# Patient Record
Sex: Female | Born: 1955
Health system: Southern US, Community
[De-identification: ages and names within clinical notes are randomized; demographics above are authoritative.]

## PROBLEM LIST (undated history)

## (undated) DIAGNOSIS — M1990 Unspecified osteoarthritis, unspecified site: Secondary | ICD-10-CM

## (undated) DIAGNOSIS — I10 Essential (primary) hypertension: Secondary | ICD-10-CM

## (undated) HISTORY — PX: CHOLECYSTECTOMY: SHX55

## (undated) HISTORY — PX: TUBAL LIGATION: SHX77

## (undated) HISTORY — PX: FOOT OSTEOTOMY: SHX957

## (undated) HISTORY — PX: TONSILLECTOMY: SUR1361

---

## 1998-07-17 ENCOUNTER — Ambulatory Visit (HOSPITAL_COMMUNITY): Admission: RE | Admit: 1998-07-17 | Discharge: 1998-07-17 | Payer: Self-pay | Admitting: Obstetrics and Gynecology

## 1998-08-30 ENCOUNTER — Inpatient Hospital Stay (HOSPITAL_COMMUNITY): Admission: AD | Admit: 1998-08-30 | Discharge: 1998-08-30 | Payer: Self-pay | Admitting: Obstetrics and Gynecology

## 1998-09-23 ENCOUNTER — Inpatient Hospital Stay (HOSPITAL_COMMUNITY): Admission: AD | Admit: 1998-09-23 | Discharge: 1998-09-26 | Payer: Self-pay | Admitting: Obstetrics and Gynecology

## 1998-11-05 ENCOUNTER — Other Ambulatory Visit: Admission: RE | Admit: 1998-11-05 | Discharge: 1998-11-05 | Payer: Self-pay | Admitting: Obstetrics and Gynecology

## 1999-11-12 ENCOUNTER — Other Ambulatory Visit: Admission: RE | Admit: 1999-11-12 | Discharge: 1999-11-12 | Payer: Self-pay | Admitting: Obstetrics and Gynecology

## 2001-02-11 ENCOUNTER — Other Ambulatory Visit: Admission: RE | Admit: 2001-02-11 | Discharge: 2001-02-11 | Payer: Self-pay | Admitting: Obstetrics and Gynecology

## 2002-04-19 ENCOUNTER — Other Ambulatory Visit: Admission: RE | Admit: 2002-04-19 | Discharge: 2002-04-19 | Payer: Self-pay | Admitting: Obstetrics and Gynecology

## 2002-06-26 ENCOUNTER — Encounter: Admission: RE | Admit: 2002-06-26 | Discharge: 2002-07-26 | Payer: Self-pay | Admitting: Orthopedic Surgery

## 2003-05-16 ENCOUNTER — Other Ambulatory Visit: Admission: RE | Admit: 2003-05-16 | Discharge: 2003-05-16 | Payer: Self-pay | Admitting: Obstetrics and Gynecology

## 2004-06-05 ENCOUNTER — Other Ambulatory Visit: Admission: RE | Admit: 2004-06-05 | Discharge: 2004-06-05 | Payer: Self-pay | Admitting: Obstetrics and Gynecology

## 2005-07-03 ENCOUNTER — Other Ambulatory Visit: Admission: RE | Admit: 2005-07-03 | Discharge: 2005-07-03 | Payer: Self-pay | Admitting: Obstetrics and Gynecology

## 2005-08-17 HISTORY — PX: ABDOMINAL HYSTERECTOMY: SHX81

## 2005-12-21 ENCOUNTER — Ambulatory Visit (HOSPITAL_COMMUNITY): Admission: RE | Admit: 2005-12-21 | Discharge: 2005-12-21 | Payer: Self-pay | Admitting: Obstetrics and Gynecology

## 2006-01-15 ENCOUNTER — Encounter: Admission: RE | Admit: 2006-01-15 | Discharge: 2006-01-15 | Payer: Self-pay | Admitting: Gastroenterology

## 2006-01-18 ENCOUNTER — Ambulatory Visit (HOSPITAL_COMMUNITY): Admission: RE | Admit: 2006-01-18 | Discharge: 2006-01-18 | Payer: Self-pay | Admitting: Gastroenterology

## 2006-01-18 ENCOUNTER — Encounter (INDEPENDENT_AMBULATORY_CARE_PROVIDER_SITE_OTHER): Payer: Self-pay | Admitting: Specialist

## 2006-04-26 ENCOUNTER — Encounter (INDEPENDENT_AMBULATORY_CARE_PROVIDER_SITE_OTHER): Payer: Self-pay | Admitting: *Deleted

## 2006-04-27 ENCOUNTER — Inpatient Hospital Stay (HOSPITAL_COMMUNITY): Admission: RE | Admit: 2006-04-27 | Discharge: 2006-04-28 | Payer: Self-pay | Admitting: Obstetrics and Gynecology

## 2006-08-17 HISTORY — PX: JOINT REPLACEMENT: SHX530

## 2007-07-06 ENCOUNTER — Inpatient Hospital Stay (HOSPITAL_COMMUNITY): Admission: RE | Admit: 2007-07-06 | Discharge: 2007-07-11 | Payer: Self-pay | Admitting: Orthopedic Surgery

## 2009-11-02 ENCOUNTER — Ambulatory Visit (HOSPITAL_COMMUNITY): Admission: RE | Admit: 2009-11-02 | Discharge: 2009-11-02 | Payer: Self-pay | Admitting: Orthopedic Surgery

## 2010-09-07 ENCOUNTER — Encounter: Payer: Self-pay | Admitting: Podiatry

## 2010-11-09 LAB — URINALYSIS, ROUTINE W REFLEX MICROSCOPIC
Glucose, UA: NEGATIVE mg/dL
Hgb urine dipstick: NEGATIVE
Ketones, ur: NEGATIVE mg/dL
Specific Gravity, Urine: 1.017 (ref 1.005–1.030)
Urobilinogen, UA: 0.2 mg/dL (ref 0.0–1.0)
pH: 6.5 (ref 5.0–8.0)

## 2010-11-09 LAB — COMPREHENSIVE METABOLIC PANEL
Calcium: 9.7 mg/dL (ref 8.4–10.5)
Chloride: 102 mEq/L (ref 96–112)
Creatinine, Ser: 0.81 mg/dL (ref 0.4–1.2)
GFR calc non Af Amer: >60 mL/min (ref >60)
Glucose, Bld: 97 mg/dL (ref 70–99)
Potassium: 4.5 mEq/L (ref 3.5–5.1)

## 2010-11-09 LAB — PROTIME-INR
INR: 1.04 (ref 0.00–1.49)
Prothrombin Time: 13.5 seconds (ref 11.6–15.2)

## 2010-11-09 LAB — CBC: MCHC: 33.2 g/dL (ref 30.0–36.0)

## 2010-12-30 NOTE — Discharge Summary (Signed)
[name], [name]        ACCOUNT NO.:  [id number]   MEDICAL RECORD NO.:  [id number]          PATIENT TYPE:  INP   LOCATION:  1608                         FACILITY:  [facility]   PHYSICIAN:  Ollen Gross, M.D.    DATE OF BIRTH:  [birth year]   DATE OF ADMISSION:  07/06/2007  DATE OF DISCHARGE:  07/11/2007                               DISCHARGE SUMMARY   ADMITTING DIAGNOSES:  1. Osteoarthritis, bilateral knees.  2. Borderline hypertension.   DISCHARGE DIAGNOSES:  1. Osteoarthritis, bilateral knees, status post bilateral total knee      replacement arthroplasty.  Surgeon - Dr. Lequita Halt.  Assistant - Avel Peace, PA-C.  Anesthesia was at combined spinal epidural.  Tourniquet  time was on the right 42 minutes, on the left 38 minutes.  1. Postoperative blood loss anemia and status post transfusion without      sequelae.   CONSULTATIONS:  None.   BRIEF HISTORY:  [name] is a 55 year old female with a history of  arthritis of both knees, progressive worsening pain and dysfunction,  extensive nonoperative management including multiple series of  injections, who now presents for total knee arthroplasties.   LABORATORY DATA:  Preoperative CBC showed a hemoglobin of 14.8,  hematocrit of 42.7, white cell count 6.2, red cell count 4.87.  Chemistry panel on admission - minimally elevated AST of 38, elevated  ALT of 66; otherwise chemistry panel all within normal limits.  PT/INR  preoperative of 12.6 and 0.9 with a PTT of 37.  Preoperative UA revealed  only small leukocyte esterase, 0-2 white cells, 0-2 red cells, a few  bacteria.  Serial CBCs were followed.  Hemoglobin unfortunately dropped  to a level of 7.7.  Given 2 units of blood.  Post transfusion hemoglobin  back up to 10.  Last night hemoglobin and hematocrit of 10.6 and 30.5.  Serial pro times followed per Coumadin protocol.  Last night PT/INR 25.6  and 2.3.  Serial BMETs were followed.  Glucose went up to 141 and came  back  down 98.  The  remaining electrolytes were obtained and were within  normal limits.   X-RAY:  Chest x-ray on July 01, 2007 revealed no active disease.   ELECTROCARDIOGRAM:  EKG on July 01, 2007 revealed normal sinus  rhythm, incomplete right bundle branch block, unconfirmed.   HOSPITAL COURSE:  The patient was admitted to [facility],  tolerated the procedure well, and later was transferred to the recovery  room on the orthopedic floor, started on the postoperative epidural and  p.o. pain medications for pain control.  Epidural was run by the  anesthesia department and was titrated as necessary.  She was actually  doing very well on the morning of day one.  We were going to start her  on Coumadin later that evening, since the epidural was going to come out  the morning of day two.  She actually got up out of bed.  Hemoglobin was  down to 9.5.  She was asymptomatic with the low hemoglobin.  Her  pressure is running a little low at 102, felt to be due to anesthesia  epidural, multiple  reasons, so we held her blood pressure medication.  We did put her on some iron supplement.  She was pretty comfortable from  the anesthesia-controlled epidural and doing well.  By day 2, she was  doing very well, actually got up and already walked in the hallway.  Epidural came out later that morning, early afternoon.  We started  Lovenox about 4 hours after.  Hemoglobin unfortunately dropped lower,  down to 7.7.  She was given 2 units of blood; post transfusion  hemoglobin back up to 10.0.  Dressings were changed on postoperative day  #2.  Both incisions looked very good.  Epidural catheter was removed by  anesthesia without difficulty.  She continued to progress well.  She was  already up walking 50 feet on day 2, continued to progress well through  the weekend.  Hemoglobin stabilized.  The wounds continued to heal well,  and by July 10, 2007, she was already walking 100 feet through  the  weekend.  She had been weaned over to p.o. medications.  By July 11, 2007, she was already up walking the hallway.  She was tolerating her  medications.  Her INR came up to a therapeutic level of 2.3, so we  discontinued Lovenox and she was ready to go home.   DISCHARGE PLAN:  1. The patient discharged home July 11, 2007.  2. Discharge diagnoses; please see above.   DISCHARGE MEDICATIONS:  1. Percocet.  2. Robaxin.  3. Nu-Iron.  4. Coumadin.   FOLLOWUP:  Follow-up the end of next week, which is the week following  Thanksgiving.  Contact the office at [phone] for appointment time.   ACTIVITY:  She is to weightbear as tolerated to both lower extremities.  Home health PT, home health OT, home health nursing, total knee  protocol.   DISPOSITION:  Home.   CONDITION ON DISCHARGE:  Improving.      Alexzandrew L. Perkins, P.A.C.      Ollen Gross, M.D.  Electronically Signed    ALP/MEDQ  D:  07/11/2007  T:  07/11/2007  Job:  [id number]   cc:   Chaney Born(?), M.D.

## 2010-12-30 NOTE — H&P (Signed)
NAMECHRISTIA, [name]        ACCOUNT NO.:  [id number]   MEDICAL RECORD NO.:  [id number]          PATIENT TYPE:  INP   LOCATION:  1608                         FACILITY:  [facility]   PHYSICIAN:  Ollen Gross, M.D.    DATE OF BIRTH:  December 14, 1955   DATE OF ADMISSION:  07/06/2007  DATE OF DISCHARGE:                              HISTORY & PHYSICAL   DATE OF OFFICE VISIT HISTORY AND PHYSICAL:  June 21, 2007.   CHIEF COMPLAINT:  Bilateral knee pain.   HISTORY OF PRESENT ILLNESS:  The patient is a 55 year old female who has  been seen by Dr. Lequita Halt for ongoing bilateral knee pain.  It has been  ongoing for quite some time now and she has been treated conservatively  in the past and also utilizing injections without benefit, felt she  would benefit undergoing knee replacements.  Risks and benefits have  been discussed.  She elects to proceed with bilateral knee replacements.   ALLERGIES:  NO KNOWN DRUG ALLERGIES.   INTOLERANCES:  SULFA CAUSES SICKNESS.   CURRENT MEDICATIONS:  Takes NSAIDs and also a blood pressure medication  which she does not recall the name of.   PAST MEDICAL HISTORY:  Arthritis, borderline hypertension.   PAST SURGICAL HISTORY:  Hysterectomy, right foot surgery and also left  foot surgery.   SOCIAL HISTORY:  Married, nonsmoker.  Seldom intake of alcohol, weekly.  Two children.   FAMILY HISTORY:  Mother with history of pulmonary fibrosis, father with  history of aortic aneurysm, deceased at age 77.   REVIEW OF SYSTEMS:  GENERAL:  No fevers, chills, night sweats.  NEUROLOGIC:  No seizure, syncope or paralysis.  RESPIRATORY:  No  shortness of breath, productive cough or hemoptysis.  CARDIOVASCULAR:  No chest pain, angina, orthopnea.  GI:  No nausea, vomiting, diarrhea or  constipation.  GU:  No dysuria, hematuria or discharge.  MUSCULOSKELETAL:  Bilateral knees.   PHYSICAL EXAMINATION:  VITAL SIGNS:  Pulse 68, respirations 14, blood  pressure 130/86.  GENERAL:  A 55 year old white female, well-nourished, well-developed, in  no acute distress, mildly anxious, accompanied by her daughter, good  historian.  HEENT:  Normocephalic, atraumatic.  Pupils are round and reactive.  Oropharynx clear.  EOMs intact.  NECK:  Supple.  CHEST:  Clear anterior and posterior.  Chest reveals no rhonchi, rales  or wheezing.  HEART:  Regular rate and rhythm.  No murmur.  S1-S2 noted.  ABDOMEN:  Soft, slightly round.  Bowel sounds present.  __________ GENITALIA:  Not done, not pertinent.  EXTREMITIES:  Bilateral knees:  Marked crepitus noted on passive range  of motion of both knees.  Motor function intact.  Sensation intact to  both legs.  Does have a little pain with positive straight leg raise on  the left.   IMPRESSION:  Osteoarthritis, bilateral knees.   PLAN:  The patient admitted to [facility] to undergo bilateral  total knee replacement arthroplasty.  Surgery will be performed by Dr.  Ollen Gross.      Alexzandrew L. Perkins, P.A.C.      Ollen Gross, M.D.  Electronically Signed    ALP/MEDQ  D:  07/06/2007  T:  07/06/2007  Job:  [id number]   cc:   Ollen Gross, M.D.  Fax: [phone]   Dr.  Chaney Born

## 2010-12-30 NOTE — Op Note (Signed)
[name], [name]        ACCOUNT NO.:  [id number]   MEDICAL RECORD NO.:  [id number]          PATIENT TYPE:  INP   LOCATION:  0005                         FACILITY:  [facility]   PHYSICIAN:  Ollen Gross, M.D.    DATE OF BIRTH:  17-Sep-1955   DATE OF PROCEDURE:  07/06/2007  DATE OF DISCHARGE:                               OPERATIVE REPORT   PREOPERATIVE DIAGNOSIS:  Osteoarthritis, bilateral knees.   POSTOPERATIVE DIAGNOSIS:  Osteoarthritis, bilateral knees.   PROCEDURE:  Bilateral total knee arthroplasty.   SURGEON:  Ollen Gross, M.D.   ASSISTANT:  Alexzandrew L. Perkins, P.A.C.   ANESTHESIA:  Combined spinal and epidural.  Please note that the spinal  had Duramorph in it.   ESTIMATED BLOOD LOSS:  Minimal.   DRAINS:  Autovac x1 each side.   TOURNIQUET TIME:  Right 42 minutes at 300 mmHg, left 38 minutes at 300  mmHg.   COMPLICATIONS:  None.   CONDITION:  Stable to recovery.   CLINICAL NOTE:  [name] is a 55 year old female with end stage  arthritis of both knees with progressively worsening pain and  dysfunction.  She has had extensive nonoperative management including  multiple series of injections and now those have ceased to be effective.  She presents now for total knee arthroplasty.   PROCEDURE IN DETAIL:  After successful administration of combined spinal  and epidural anesthetic, a tourniquet was placed high on both thighs and  both lower extremities were prepped and draped in the usual sterile  fashion.  The right lower extremity is more symptomatic so we did that  first.  The right lower extremity was wrapped in an Esmarch, knee  flexed, and tourniquet inflated to 300 mmHg.  A midline incision was  made with a 10 blade through the subcutaneous tissue to the level of the  extensor mechanism.  A fresh blade is used to make a medial parapatellar  arthrotomy.  Soft tissue over the proximal medial tibia is  subperiosteally elevated to the joint line with  the knife into the  semimembranosus bursa with a Cobb elevator.  Soft tissue laterally is  also elevated with attention being paid to avoid the patellar tendon on  the tibial tubercle.  The patella was subluxed laterally, knee flexed 90  degrees, ACL and PCL removed.  A drill was used to create a starting  hole in the distal femur and the canal was thoroughly irrigated.  The 5  degrees right valgus alignment guide is placed referencing off the  posterior condyles, rotation is marked, and a block pinned to remove 11  mm off the distal femur.  I took 11 because of a preop flexion  contracture.  Distal femoral resection is made with an oscillating saw.  Sizing block is placed, size 3 is the most appropriate.  Rotation is  marked off the epicondylar axis and a size 3 cutting block is placed.  The anterior, posterior, and chamfer cuts were made.   The tibia is subluxed forward and the menisci are removed.  The  extramedullary tibial alignment guide is placed referencing proximally  at the medial aspect of the tibial tubercle  and distally along the  second metatarsal axis and tibial crest.  The block is pinned to remove  approximately 2 mm off the more deficient lateral side.  She had  deficiencies on both sides of the tibia.  The tibial resection is then  made with an oscillating saw.  The size 3 was the most appropriate  tibial component.  The proximal tibia was prepared with a modular drill  and keel punch for size 3.  Femoral preparation is completed with the  intercondylar cut.   Size 3 mobile bearing tibial trial, size 3 posterior stabilized femoral  trial, and a 12.5 mm posterior stabilized rotating platform insert trial  were placed.  With a 12.5, she is slightly hyperextended, we went to a  15 which allowed for full extension with excellent varus and valgus  balance throughout full range of motion.  The patella was everted and  thickness measured to be 21 mm.  Freehand resection  taken to 12 mm, 35  template is placed, lug holes were drilled, trial patella was placed and  it tracks normally.  Osteophytes were removed off the posterior femur  with the trial in place.  All trials were removed and the cut bone  surfaces were prepared with pulsatile lavage.  The cement was mixed and  once ready for implantation, the size 3 mobile bearing tibial tray, size  3 posterior stabilized femur, and 35 patella are cemented into place.  The patella was held with a clamp.  A trial 15 insert was placed, the  knee held in full extension, and all extruded cement removed.  When the  cement was fully hardened, the wound is copiously irrigated with saline  solution and the permanent 15 mm posterior stabilized rotating platform  insert is placed into the tibial tray.  The extensor mechanism was then  closed over the Autovac drain with interrupted #1 PDS.  Flexion against  gravity is 130 degrees.  The subcu is closed with interrupted 2-0  Vicryl.  We then placed a moist sponge and wrapped an Esmarch loosely  around the knee.  I hooked up the drain to the Autovac suction canister.  We then addressed the left knee.   The left lower extremity was wrapped in an Esmarch and tourniquet  inflated to 300 mmHg.  The same midline incision is made as on the other  side.  A medial parapatellar arthrotomy was made.  Soft tissue releases  were performed.  The patella was subluxed laterally, knee flexed 90  degrees, ACL and PCL removed.  A drill was used to create a starting  hole in the distal femur and the canal was thoroughly irrigated.  A 5  degrees left valgus alignment guide is placed and referencing off the  posterior condyles, rotation is marked and block pinned to remove 11 mm  off the distal femur.  Again, we took an 11 because of a flexion  contracture.  Sizing block was placed, size 3 is the most appropriate.  Rotation is marked at the epicondylar axis.  Size 3 cutting block is  placed and  the anterior, posterior, and chamfer cuts were made.   The tibia was subluxed forward.  Extramedullary tibial alignment guide  is placed referencing proximally at the medial aspect of the tibial  tubercle and distally along the second metatarsal axis and tibial crest.  We pinned it to take 2 mm off the more deficient medial side as both  sides were deficient.  Tibial resection is  made with an oscillating saw.  Size 3 is the most appropriate tibial component and the proximal tibia  was prepared with the modular drill and keel punch for a size 3.  Femoral preparation is completed with the intercondylar cut.   Size 3 mobile bearing tibial trial, size 3 posterior stabilized femoral  trial, and a 12.5 mm posterior stabilized rotating platform insert trial  were placed. With a 12.5, full extension was achieved with excellent  varus and valgus balance throughout full range of motion.  The patella  was everted, thickness measured to be 22 mm.  Freehand preparation is  made resecting down 13 mm, a 35 template placed.  Lug holes were  drilled, trial patella was placed, and it tracks normally.  Osteophytes  were removed off the posterior femur with the trial placed.  All trials  were removed and cut bone surfaces prepared with pulsatile lavage.  Cement was mixed.  Once ready for implantation, size 3 mobile bearing  tibial tray, size 3 posterior stabilized femur, and 35 patella are  cemented in place.  The patella is held with the clamp.  A trial 12.5  insert was placed, knee held in full extension, all extruded cement  removed.  Once the cement had fully hardened, we copiously irrigated  with saline solution.  The trials were removed and FloSeal injected on  the posterior capsule, mediolateral gutters, and suprapatellar area.  The permanent 12.5 mm posterior stabilized rotating platform insert was  then placed.  A moist sponge is placed and the tourniquet released with  a total time of 38 minutes.   The sponge was removed and there is minimal  bleeding encountered.  The bleeding that is encountered was stopped with  electrocautery.  The extensor mechanism was closed over the Autovac  drain with interrupted #1 PDS.  Flexion against gravity to 135 degrees.  Subcu was closed with interrupted 2-0 Vicryl, subcuticular with running  4-0 Monocryl.  The drain was hooked to suction.  The incision was  cleaned and dried.  A bulky sterile dressing was applied.  While we were  doing this, on the opposite side we did the subcuticular Monocryl and  then placed Steri-Strips.  A bulky sterile dressing and both drains had  already been hooked to suction.  Both knees were then placed into  immobilizers and she is awakened and transferred to recovery in stable  condition.      Ollen Gross, M.D.  Electronically Signed     FA/MEDQ  D:  07/06/2007  T:  07/06/2007  Job:  [id number]

## 2011-01-02 NOTE — Consult Note (Signed)
[name], [name]        ACCOUNT NO.:  [id number]   MEDICAL RECORD NO.:  [id number]          PATIENT TYPE:  OIB   LOCATION:  1611                         FACILITY:  [facility]   PHYSICIAN:  Jordan Hawks. Elnoria Howard, MD    DATE OF BIRTH:  08/28/1955   DATE OF CONSULTATION:  04/26/2006  DATE OF DISCHARGE:                                   CONSULTATION   REFERRING PHYSICIAN:  Adolph Pollack, M.D.   REASON FOR CONSULTATION:  CBD stone.   HISTORY OF PRESENT ILLNESS:  This is a 55 year old female with a past  medical history of hypertension and anxiety who is status post laparoscopic-  assisted bilateral salpingo-oophorectomy and transvaginal hysterectomy and  laparoscopic cholecystectomy.  The patient apparently had complaints of  abdominal pain that was possibly consistent with endometriosis.  Additionally, she also had symptoms that were consistent with biliary colic  and she subsequently underwent evaluation by Dr. Avel Peace.  There are  multiple gallstones seen in her gallbladder and it was felt that double  procedure would be of benefit for her.  During the operation, an  intraoperative cholangiogram was performed and there was a small filling  defect at the bifurcation of the extrahepatic duct.  No other abnormality  was discerned.  There was no dilation of her bile ducts.  The patient  tolerated the surgery well and no immediate complications were noted.  At  this time, she has consulted in regards to possible extraction of this  stone.  Past medical history and past surgical history is as stated above.   MEDICATIONS:  She takes hydrochlorothiazide and vitamin C.   PHYSICAL EXAMINATION:  VITAL SIGNS:  Pending at this time.  GENERAL:  The patient is in no acute distress, alert and oriented.  HEENT:  Normocephalic, atraumatic.  Extraocular muscles intact.  Pupils  equal, round and reactive to light.  Neck is supple.  No lymphadenopathy.  LUNGS:  Clear to auscultation  bilaterally.  CARDIOVASCULAR:  Regular rate and rhythm without murmurs, gallops or rubs.  ABDOMEN:  Flat, soft, is tender at the incision site.  There is no evidence  of any excessive bleeding at the bandages.  EXTREMITIES:  No clubbing, cyanosis or edema.   LABORATORY VALUES:  Laboratory values on April 22, 2006, white blood cell  count 6.5, hemoglobin 15.3, MCV is 88.6, platelets at 257.  Sodium 140,  potassium 3.8, chloride 105, CO2 28, glucose 98.  BUN is 17, creatinine 0.8.  Total bili 0.6, alkaline phosphate is 49, AST 27, ALT 23, albumin is 3.4.   IMPRESSION:  1. Abnormal CT scan.  2. Status post laparoscopic cholecystectomy.  3. Status post bilateral salpingo-oophorectomy and transient vaginal      hysterectomy which are laparoscopically-assisted.  I have reviewed the      patient's intraoperative cholangiogram in detail.  There is a filling      defect at the bifurcation of the extrahepatic ducts.  It is difficult      to discern if this is an air bubble versus a true stone; however, she      does have evidence of cholelithiasis.  An  endoscopic retrograde      cholangiopancreatography is warranted at this time.  However, I do not      feel that there is an emergent need, but with further discussion from      the patient, she desires to have the procedure done during this      hospitalization.  There are no laboratory abnormalities to suggest      obstruction at this time and I did offer her the option of having this      performed as an outpatient.  I have discussed the risk of bleeding,      infection, perforation, medication reactions, pancreatitis and the risk      of death all of which are not exclusive of any other unforeseen      complications that may occur.  The patient acknowledges these risks and      wishes to proceed at this time.  Plan is for an ERCP tomorrow.      Jordan Hawks Elnoria Howard, MD  Electronically Signed     PDH/MEDQ  D:  04/26/2006  T:  04/26/2006   Job:  [id number]   cc:   Adolph Pollack, M.D.  [address], [address]  [place]  [place] [address]   Dineen Kid. Rana Snare, M.D.  Fax: [phone]   Marjory Lies, M.D.  Fax: [phone]   Thalia Bloodgood, M.D.

## 2011-01-02 NOTE — Op Note (Signed)
NAME:  [name], [name]        ACCOUNT NO.:  [id number]   MEDICAL RECORD NO.:  [id number]          PATIENT TYPE:  AMB   LOCATION:  ENDO                         FACILITY:  [facility]   PHYSICIAN:  Anselmo Rod, M.D.  DATE OF BIRTH:  07-06-1956   DATE OF PROCEDURE:  01/18/2006  DATE OF DISCHARGE:  01/18/2006                                 OPERATIVE REPORT   PROCEDURE PERFORMED:  Esophagogastroduodenoscopy with multiple cold  biopsies.   ENDOSCOPIST:  Anselmo Rod, M.D.   INSTRUMENT USED:  Olympus video panendoscope.   INDICATION FOR PROCEDURE:  A 55 year old white female with a history of  gallstones undergoing an EGD to rule out peptic ulcer disease.  The patient  has had problems with reflux and epigastric pain with occasional  postprandial nausea.   PREPROCEDURE PREPARATION:  Informed consent was procured from the patient.  The patient was fasted for 4 hours prior to the procedure.  The risks and  benefits of the procedure were discussed with her in great detail.   PREPROCEDURE PHYSICAL:  VITAL SIGNS:  The patient had stable vital signs.  NECK:  Supple.  CHEST:  Clear to auscultation.  S1, S2 regular.  ABDOMEN:  Soft with normal bowel sounds.   DESCRIPTION OF PROCEDURE:  The patient was placed in the left lateral  decubitus position and sedated with 75 mcg of fentanyl and 7.5 mg of Versed  in slow incremental dose.  Once the patient was adequately sedate and  maintained on low-flow oxygen and continuous cardiac monitoring, the Olympus  video panendoscope was advanced through the mouthpiece, over the tongue,  into the esophagus under direct vision.  Grade 1 distal esophagitis was  noted.  A small biopsy was done above the Z line to rule out Barrett's  mucosa.  A small hiatal hernia was seen on high retroflexion.  Diffuse  gastritis was noted with antral erosions.  Biopsies were done to rule out H.  pylori.  Old heme was noted in the distal stomach.  Mild duodenitis in  the  bulb was appreciated.  Small distal to the bulb to 60 cm appeared normal.  There was no outlet obstruction.  The patient tolerated the procedure well  without immediate complications.   IMPRESSION:  1.  Grade 1 distal esophagitis.  2.  Small patch of Barrett's-like mucosa biopsied above Z line, results      pending.  3.  Small hiatal hernia seen on retroflexion.  4.  Diffuse gastritis with antral erosions, biopsies done for Helicobacter      pylori.  5.  Mild duodenitis in the duodenal bulb.  6.  Normal small bowel distal to the bulb at 60 cm.   RECOMMENDATIONS:  1.  A trial of Protonix has been advised.  The patient has been advised to      take 40 mg one p.o. q.a.m. 15-20 minutes before breakfast.  2.  Avoid all nonsteroidals including aspirin for now.  3.  Await pathology results.  4.  Treat with antibiotics if H. pylori present on biopsies.  5.  Proceed with a surgical evaluation with Adolph Pollack, M.D.  6.  Dineen Kid Rana Snare, M.D., and Dr. Abbey Chatters to coordinate her hysterectomy      and cholecystectomy to be done at the same time if possible.  7.  Outpatient follow-up within the next week for further recommendations.      A trial of Xifaxan 200 mg 2 PO QID has been advised for her IBS-like      symptoms to help with the gas and bloating. She is to pick up a      prescription from our office.      Anselmo Rod, M.D.  Electronically Signed     JNM/MEDQ  D:  01/19/2006  T:  01/20/2006  Job:  [id number]   cc:   Dineen Kid. Rana Snare, M.D.  Fax: [phone]   Marjory Lies, M.D.  Fax: [phone]   Rema Fendt, F.N.P.  [facility]   Adolph Pollack, M.D.  [address], [address]  [place]  [place] [address]

## 2011-01-02 NOTE — H&P (Signed)
NAME:  [name], [name]        ACCOUNT NO.:  [id number]   MEDICAL RECORD NO.:  [id number]          PATIENT TYPE:  AMB   LOCATION:  SDC                           FACILITY:  [facility]   PHYSICIAN:  Dineen Kid. Rana Snare, M.D.    DATE OF BIRTH:  10-06-1955   DATE OF ADMISSION:  12/21/2005  DATE OF DISCHARGE:                                HISTORY & PHYSICAL   HISTORY OF PRESENT ILLNESS:  Ms. Sherron Flemings is a 55 year old G2, P2 with  worsening problems with irregular bleeding, also dyspareunia and pelvic  pain. This has been going on for a number of years. She does have fibroids.  Underwent a recent saline effusion ultrasound showing no intracavitary  masses and normal endometrial lining. She has previous taken birth control  pills without much benefit and at this time desires definitive surgical  intervention and requests hysterectomy. Because of her age she also desires  bilateral salpingo-oophorectomy; she does have a strong family history of  the women requiring hysterectomies and has discussed this with her sister  and family members and wishes to proceed with this.   PAST MEDICAL HISTORY:  Noncontributory.   PAST SURGICAL HISTORY:  Significant for a tubal ligation.   MEDICATIONS:  Currently on Wellbutrin.   ALLERGIES:  No known drug allergies.   PHYSICAL EXAMINATION:  VITAL SIGNS:  Blood pressure today was 118/62.  HEART:  Regular rate and rhythm.  LUNGS:  Clear to auscultation bilaterally.  ABDOMEN:  Nondistended, nontender.  PELVIC EXAM:  Normal external genitalia, BSU. The uterus is anteverted,  mobile, slightly increased in size. Mildly tender to deep palpation. No  adnexal masses are palpable.   Ultrasound showed multiple small fibroids, myometrium suspicious for  adenomyosis. She also does have a right ovarian cyst that has an internal  area suspicious for endometrioma.   IMPRESSION AND PLAN:  Menometrorrhagia, dyspareunia, pelvic pain and  fibroids. Ultrasound suspicious for  adenomyosis, possible endometrioma on  the right ovary. The patient has not improved with expectant management or  with birth control pills and desires definitive surgical intervention.  Requests laparoscopic assisted vaginal hysterectomy with bilateral salpingo-  oophorectomy. Discussed the risks and benefits of the procedure at length  which includes but are not limited to risk of infection, bleeding, damage to  the bowel or bladder, ureters. Possibility that this may not alleviate the  pain. Risks associated with anesthesia, risk associated with blood loss,  blood transfusion. She does give her informed consent and wishes to proceed.      Dineen Kid Rana Snare, M.D.  Electronically Signed     DCL/MEDQ  D:  12/18/2005  T:  12/18/2005  Job:  [id number]

## 2011-01-02 NOTE — Op Note (Signed)
NAMECADIE, SORCI        ACCOUNT NO.:  [id number]   MEDICAL RECORD NO.:  [id number]          PATIENT TYPE:  OIB   LOCATION:  1611                         FACILITY:  [facility]   PHYSICIAN:  Adolph Pollack, M.D.DATE OF BIRTH:  18-Jan-1956   DATE OF PROCEDURE:  04/26/2006  DATE OF DISCHARGE:                                 OPERATIVE REPORT   PREOPERATIVE DIAGNOSIS:  Symptomatic cholelithiasis.   POSTOPERATIVE DIAGNOSES:  1. Symptomatic cholelithiasis.  2. Choledocholithiasis.   PROCEDURE:  Laparoscopic cholecystectomy with intraoperative cholangiogram.   SURGEON:  Avel Peace, MD.   ASSISTANT:  Jerelene Redden, MD.   ANESTHESIA:  General.   INDICATIONS:  This is a 55 year old female scheduled to have a hysterectomy  in May who was noted to be hypertensive.  She was evaluated and ended up  having an ultrasound and incidentally cholelithiasis was noted.  Upon  further questioning, she has had intermittent episodes of biliary colic.  She now presents for a combined procedure of a laparoscopic-assisted vaginal  hysterectomy as well as laparoscopic cholecystectomy.  We have discussed the  procedure and the risks preoperatively.   TECHNIQUE:  Dr. Rana Snare proceeded with a laparoscopic-assisted vaginal  hysterectomy and bilateral salpingo-oophorectomy.  Upon completion of this  procedure, he left a subumbilical trocar in. A laparoscopic drape was then  placed over the patient as she was taken out of lithotomy position, laid  supine and abdominal wall was kept sterile.  The laparoscope was introduced  and she was placed in the reverse Trendelenburg position with the right side  tilted slightly upward.  The remaining irrigation fluid from the first  procedure was noted.  An 11-mm trocar was placed through an epigastric  incision and two 5 mm trocars placed in the right mid lateral abdomen.  The  remaining irrigation fluid was evacuated by way of suction.  The gallbladder  was  identified and the fundus grasped.  Multiple adhesions between the  omentum and gallbladder were noted.  Using careful blunt dissection, these  were taken down.  The fundus of the gallbladder was then retracted toward  the right shoulder.  The infundibulum was grasped and using careful blunt  dissection, I mobilized the infundibulum.  I identified an anterior branch  of the cystic artery lying anterior to the cystic duct.  I created a window  around this artery, clipped it and divided it.  I then created a large  window around the cystic duct.  There appeared to be a stone trying to pass  from the gallbladder to the cystic duct and I was able to milk this back up  into the gallbladder.  I then placed a clip at the cystic duct gallbladder  junction and made a small incision in the cystic duct.  I milked the cystic  duct and did milk a stone back out through the small incision.  It was  evacuated basically by way of the suction as I crushed part of it up.  A  cholangiocatheter was then passed through the anterior abdominal wall,  placed into the cystic duct and a cholangiogram was performed.   Under real time fluoroscopy,  dilute contrast material was injected into the  cystic duct which was of long length.  The common hepatic, right and left  hepatic and common bile ducts were all opacified.  Contrast drained fairly  promptly into the duodenum however a filling defect was seen in the common  hepatic duct.  I subsequently flushed saline through the catheter and then  reimaged the bile ducts with a second injection of contrast and again still  saw a filling defect or two.  I had the radiologist look at this and he was  suspicious for choledocholithiasis.  There did not appear to be an  obstructive phenomena at this time.   The cholangiocatheter was removed, the cystic duct was clipped three times  proximally and divided.  I then identified the posterior cystic artery,  clipped it and divided  it.  The gallbladder was dissected free from the  liver intact using electrocautery and placed in an Endopouch bag.   The gallbladder fossa was irrigated, bleeding points controlled with  electrocautery.  Irrigation fluid was evacuated.  The gallbladder fossa was  inspected again and no bleeding or bile leak was noted.  I then removed the  gallbladder through the subumbilical port by slightly enlarging the skin  incision.  The gallbladder was then sent to pathology.  The subumbilical  fascial defect was closed under laparoscopic vision by using a #0 Vicryl  figure-of-eight type suture.  The remaining trocars were removed and a  pneumoperitoneum was released.   The skin incisions were closed with 4-0 Monocryl subcuticular stitches  followed by Steri-Strips and sterile dressings.   She tolerated the procedure well without any apparent complications and was  taken to recovery in satisfactory condition.  I placed a call into Dr.  Charna Elizabeth requesting a GI consultation for consideration of ERCP.      Adolph Pollack, M.D.  Electronically Signed     TJR/MEDQ  D:  04/26/2006  T:  04/26/2006  Job:  [id number]   cc:   Anselmo Rod, M.D.  Fax: [phone]   Dineen Kid. Rana Snare, M.D.  Fax: [phone]

## 2011-01-02 NOTE — Op Note (Signed)
NAME:  [name], [name]         ACCOUNT NO.:  [id number]   MEDICAL RECORD NO.:  [id number]          PATIENT TYPE:  AMB   LOCATION:  DAY                          FACILITY:  [facility]   PHYSICIAN:  Dineen Kid. Rana Snare, M.D.    DATE OF BIRTH:  15-Feb-1956   DATE OF PROCEDURE:  04/26/2006  DATE OF DISCHARGE:                                 OPERATIVE REPORT   PREOPERATIVE DIAGNOSES:  1. Menometrorrhagia.  2. Dyspareunia.  3. Pelvic pain.  4. Fibroids.   POSTOPERATIVE DIAGNOSES:  1. Menometrorrhagia.  2. Dyspareunia.  3. Pelvic pain.  4. Fibroids.   OPERATION/PROCEDURE:  Laparoscopic-assisted vaginal hysterectomy and  bilateral salpingo-oophorectomy.   SURGEON:  Dineen Kid. Rana Snare, M.D.   ASSISTANT:  Duke Salvia. Marcelle Overlie, M.D.   ANESTHESIA:  General endotracheal anesthesia.   INDICATIONS:  Mrs. [name] is a 55 year old gravida 2, para 2 with  worsening problems with menometrorrhagia, dyspareunia and pelvic pain,  several fibroids, and also suspicion for adenomyosis and pain with  intercourse.  She has failed conservative medical therapy and desires  laparoscopic-assisted vaginal hysterectomy with bilateral salpingo-  oophorectomy.  She is also having a combined procedure with Dr. Abbey Chatters  and a cholecystectomy for gallbladder problems.  Risks and benefits of the  procedure were discussed at length.  These include but are not limited to  risk of infection, bleeding, damage to the bowel, bladder, ureters, risks  associated with anesthesia, risks associated with bleeding and blood  transfusions.  She does give her informed consent and wished to proceed.   OPERATIVE FINDINGS:  Eight-week size fibroid, boggy uterus.  Normal-  appearing ovaries, normal-appearing appendix and liver.  Normal cul-de-sac.   DESCRIPTION OF PROCEDURE:  After adequate anesthesia, the patient was placed  in the dorsal lithotomy position. She was sterilely prepped and draped and  the bladder sterilely drained.   Speculum was placed and Hulka tenaculum was  placed on the cervix.  A 1 cm infraumbilical skin incision was made.  A  Veress needle was inserted.  The abdomen was insufflated to dullness to  percussion.  A 11 mm trocar was inserted.  The above findings were noted.  A  5 mm trocar was inserted to the left of the midline, two fingerbreadths  above the pubic symphysis under direct visualization.  The Gyrus grasping  forceps was used to grasp the right infundibulopelvic ligament.  It was  coagulated and dissected down across the infundibulopelvic ligament, down to  the round ligament, to the inferior portion of the broad ligament with good  hemostasis achieved and care taken to avoid the underlying vessels and  ureter.  The left infundibulopelvic ligament was identified, grasped,  desiccated using the Gyrus, dissected down across the round ligament.  Again  good hemostasis was achieved at the pedicles and care taken to avoid the  underlying ureter.  The bladder was elevated and small window was made at  the uterovesical junction.  A window was made at the uterovesical junction.  The abdomen was then desufflated.  Legs  repositioned.  A posterior  colpotomy was performed, circumscribed with Bovie cautery.  The LigaSure  clamp across  the uterosacral ligaments, ligated and dissected with Mayo  scissors.  Cardinal ligaments were ligated with LigaSure, dissected with  Mayo scissors.  The __________  ligated in a similar fashion and dissected  with Mayo scissors.  The anterior vaginal mucosa was dissected off the  anterior portion of the cervix and anterior peritoneum was entered.  Deaver  retractor was placed underneath the bladder.  The inferior portion of the  broad ligament was clamped bilaterally with the LigaSure, dissected with  Mayo scissors.  The uterine fundus was grasped and the uterus was delivered  with the tubes and ovaries intact.  The right and left uterosacral ligaments  were  identified and grasped with a Heaney clamp.  Bleeder at the left  uterosacral ligament was identified, clamped with a LigaSure. Good  hemostasis was achieved.  A 0 Monocryl suture was used in a figure-of-eight  fashion on both uterosacral ligaments with good hemostasis achieved.  Posterior peritoneum was closed in a pursestring fashion with 0 Monocryl  suture.  The posterior vaginal mucosa was closed with figure-of-eights of 0  Monocryl suture in vertical fashion.  Anterior vaginal mucosa was closed in  a similar fashion with good approximation.  Good support of the vagina with  the uterosacral ligaments plicated in the midline.  Foley catheter was  placed with good return of yellow urine noted.  The legs were repositioned,  abdomen reinsufflated.  __________  irrigator was inserted.  Small  peritoneal bleeders were coagulated with bipolar cautery and after a copious  amount of irrigation, adequate hemostasis of the cul-de-sac was noted.  The  5 mm trocar was removed and closed with 3-0 Vicryl  Rapide subcuticular  stitch.  The infraumbilical trocar was left in place for Dr. Maris Berger  cholecystectomy and at this point he did take over.  At this point the  sponge, needle and instrument counts were normal x3.  Estimated blood loss  was 400 mL mostly due to the vaginal cuff bleeding.  The patient did receive  1 g of Rocephin preoperatively.      Dineen Kid Rana Snare, M.D.  Electronically Signed     DCL/MEDQ  D:  04/26/2006  T:  04/26/2006  Job:  [id number]

## 2011-01-02 NOTE — H&P (Signed)
NAME:  [name], [name]         ACCOUNT NO.:  [id number]   MEDICAL RECORD NO.:  [id number]          PATIENT TYPE:  AMB   LOCATION:  DAY                          FACILITY:  [facility]   PHYSICIAN:  Dineen Kid. Rana Snare, M.D.    DATE OF BIRTH:  Oct 08, 1955   DATE OF ADMISSION:  DATE OF DISCHARGE:                                HISTORY & PHYSICAL   HISTORY OF PRESENT ILLNESS:  [name] is a 55 year old G2, P2, with  worsening problems with menometrorrhagia, dyspareunia and pelvic pain.  She  has had a normal saline infusion ultrasound and evaluation of the  endometrial lining; however, there are several fibroids and also suspicion  for adenomyosis, also suspicion for a right endometrioma.  She does have a  strong family history of endometriosis, and she does continue to have pelvic  pain and pain with intercourse.  She desires definitive surgical  intervention and presents for laparoscopically-assisted vaginal  hysterectomy.  She also has been having gallbladder problems and presents  for a combined procedure today by Dr. Abbey Chatters.  She was previously  scheduled for a laparoscopically-assisted vaginal hysterectomy and bilateral  salpingo-oophorectomy back in May.  At the time of presentation to the  hospital she was having elevated blood pressures and the surgery was  cancelled until further evaluation of that was carried out.   PAST MEDICAL HISTORY:  1. Hypertension.  2. Anxiety.   PAST SURGICAL HISTORY:  Tubal ligation.   MEDICATIONS:  She reports two blood pressure medicines and a GI medicine,  which she is not sure what the names are today.   She has no known drug allergies.   PHYSICAL EXAMINATION:  VITAL SIGNS:  Her blood pressure today was 142/88.  CARDIAC:  Regular rate and rhythm.  LUNGS:  Clear to auscultation bilaterally.  ABDOMEN:  Nondistended, nontender.  PELVIC:  The uterus is anteverted, mobile, nontender.   Ultrasound evaluation shows a 9 x 5 x 5 cm uterus with  several small  fibroids and suspicion for adenomyosis.  No significant cystocele or  rectocele are present.   ASSESSMENT:  1. Menometrorrhagia.  2. Dyspareunia.  3. Pelvic pain.  4. Fibroids.  5. The patient desires definitive surgical intervention.   PLAN:  Laparoscopically-assisted vaginal hysterectomy with bilateral  salpingo-oophorectomy.  Risks and benefits of the procedure were discussed  at length, which include but not limited to risk of bleeding; infection;  damage to bowel, bladder, ureters; the possibility that this may not  alleviate the pain; risks associated with anesthesia; risks associated with  bleeding; risks associated with blood transfusion.  She does give her  informed consent and wished to proceed.  She also understands bilateral  salpingo-oophorectomy will make her surgically menopausal.  She understands  the risks associated with that and also has a good understanding about  hormone replacement therapy at this time.  She is going to try to do without  hormone replacement therapy.      Dineen Kid Rana Snare, M.D.  Electronically Signed     DCL/MEDQ  D:  04/23/2006  T:  04/23/2006  Job:  [id number]

## 2011-05-26 LAB — BASIC METABOLIC PANEL
BUN: 4 — ABNORMAL LOW
BUN: 5 — ABNORMAL LOW
BUN: 8
CO2: 27
CO2: 27
CO2: 30
Calcium: 7.9 — ABNORMAL LOW
Calcium: 8.1 — ABNORMAL LOW
Calcium: 8.2 — ABNORMAL LOW
Chloride: 104
Chloride: 108
Chloride: 112
Creatinine, Ser: 0.7
Creatinine, Ser: 0.72
GFR calc Af Amer: >60
GFR calc Af Amer: >60
GFR calc non Af Amer: >60
GFR calc non Af Amer: >60
GFR calc non Af Amer: >60
Glucose, Bld: 109 — ABNORMAL HIGH
Glucose, Bld: 141 — ABNORMAL HIGH
Potassium: 4.6
Potassium: 4.6
Sodium: 135
Sodium: 139
Sodium: 142

## 2011-05-26 LAB — CBC
HCT: 22.7 — ABNORMAL LOW
HCT: 27.4 — ABNORMAL LOW
HCT: 28.2 — ABNORMAL LOW
Hemoglobin: 10 — ABNORMAL LOW
Hemoglobin: 7.7 — CL
Hemoglobin: 9.5 — ABNORMAL LOW
Hemoglobin: 14.8
MCHC: 34.6
MCHC: 35.4
MCV: 87.2
MCV: 87.3
Platelets: 143 — ABNORMAL LOW
Platelets: 187
RBC: 3.14 — ABNORMAL LOW
RBC: 4.87
RDW: 13.6
RDW: 14.8
WBC: 5.8
WBC: 8.5

## 2011-05-26 LAB — PROTIME-INR
INR: 1.1
INR: 1.3
INR: 1.5
Prothrombin Time: 16.7 — ABNORMAL HIGH
Prothrombin Time: 18.3 — ABNORMAL HIGH
Prothrombin Time: 12.6

## 2011-05-26 LAB — COMPREHENSIVE METABOLIC PANEL
ALT: 66 — ABNORMAL HIGH
Alkaline Phosphatase: 64
CO2: 27
Chloride: 101
GFR calc non Af Amer: >60
Glucose, Bld: 94
Potassium: 3.7
Sodium: 136

## 2011-05-26 LAB — URINE MICROSCOPIC-ADD ON

## 2011-05-26 LAB — URINALYSIS, ROUTINE W REFLEX MICROSCOPIC
Hgb urine dipstick: NEGATIVE
Ketones, ur: NEGATIVE
Protein, ur: NEGATIVE

## 2011-05-26 LAB — TYPE AND SCREEN

## 2011-07-15 ENCOUNTER — Encounter (HOSPITAL_BASED_OUTPATIENT_CLINIC_OR_DEPARTMENT_OTHER): Payer: Self-pay | Admitting: *Deleted

## 2011-07-15 NOTE — Progress Notes (Signed)
To bring overnight bag'

## 2011-07-16 ENCOUNTER — Other Ambulatory Visit: Payer: Self-pay

## 2011-07-16 ENCOUNTER — Encounter (HOSPITAL_BASED_OUTPATIENT_CLINIC_OR_DEPARTMENT_OTHER)
Admission: RE | Admit: 2011-07-16 | Discharge: 2011-07-16 | Disposition: A | Discharge status: Home / Self Care | Payer: BC Managed Care – PPO | Source: Ambulatory Visit | Attending: Orthopedic Surgery | Admitting: Orthopedic Surgery

## 2011-07-22 ENCOUNTER — Encounter (HOSPITAL_BASED_OUTPATIENT_CLINIC_OR_DEPARTMENT_OTHER): Payer: Self-pay | Admitting: Anesthesiology

## 2011-07-22 ENCOUNTER — Encounter (HOSPITAL_BASED_OUTPATIENT_CLINIC_OR_DEPARTMENT_OTHER): Payer: Self-pay

## 2011-07-22 ENCOUNTER — Ambulatory Visit (HOSPITAL_BASED_OUTPATIENT_CLINIC_OR_DEPARTMENT_OTHER): Payer: BC Managed Care – PPO | Admitting: Anesthesiology

## 2011-07-22 ENCOUNTER — Encounter (HOSPITAL_BASED_OUTPATIENT_CLINIC_OR_DEPARTMENT_OTHER)
Admission: RE | Disposition: A | Discharge status: Home / Self Care | Payer: Self-pay | Source: Ambulatory Visit | Attending: Orthopedic Surgery

## 2011-07-22 ENCOUNTER — Ambulatory Visit (HOSPITAL_BASED_OUTPATIENT_CLINIC_OR_DEPARTMENT_OTHER)
Admission: RE | Admit: 2011-07-22 | Discharge: 2011-07-23 | Disposition: A | Discharge status: Home / Self Care | Payer: BC Managed Care – PPO | Source: Ambulatory Visit | Attending: Orthopedic Surgery | Admitting: Orthopedic Surgery

## 2011-07-22 DIAGNOSIS — Z472 Encounter for removal of internal fixation device: Secondary | ICD-10-CM | POA: Insufficient documentation

## 2011-07-22 DIAGNOSIS — M66879 Spontaneous rupture of other tendons, unspecified ankle and foot: Secondary | ICD-10-CM | POA: Insufficient documentation

## 2011-07-22 DIAGNOSIS — IMO0002 Reserved for concepts with insufficient information to code with codable children: Secondary | ICD-10-CM | POA: Insufficient documentation

## 2011-07-22 DIAGNOSIS — M216X9 Other acquired deformities of unspecified foot: Secondary | ICD-10-CM | POA: Insufficient documentation

## 2011-07-22 DIAGNOSIS — M79672 Pain in left foot: Secondary | ICD-10-CM

## 2011-07-22 DIAGNOSIS — Z01812 Encounter for preprocedural laboratory examination: Secondary | ICD-10-CM | POA: Insufficient documentation

## 2011-07-22 DIAGNOSIS — M2010 Hallux valgus (acquired), unspecified foot: Secondary | ICD-10-CM | POA: Insufficient documentation

## 2011-07-22 DIAGNOSIS — Z01810 Encounter for preprocedural cardiovascular examination: Secondary | ICD-10-CM | POA: Insufficient documentation

## 2011-07-22 DIAGNOSIS — I10 Essential (primary) hypertension: Secondary | ICD-10-CM | POA: Insufficient documentation

## 2011-07-22 HISTORY — DX: Essential (primary) hypertension: I10

## 2011-07-22 HISTORY — DX: Unspecified osteoarthritis, unspecified site: M19.90

## 2011-07-22 LAB — POCT HEMOGLOBIN-HEMACUE: Hemoglobin: 13.6 g/dL (ref 12.0–15.0)

## 2011-07-22 SURGERY — CALCANEAL OSTEOTOMY WITH TARSAL METATARSAL FUSION
Anesthesia: General | Site: Foot | Laterality: Left | Wound class: Clean

## 2011-07-22 MED ORDER — DEXAMETHASONE SODIUM PHOSPHATE 10 MG/ML IJ SOLN
INTRAMUSCULAR | Status: DC | PRN
  Administered 2011-07-22: 10 mg via INTRAVENOUS

## 2011-07-22 MED ORDER — LACTATED RINGERS IV SOLN
INTRAVENOUS | Status: DC
  Administered 2011-07-22: 07:00:00 via INTRAVENOUS

## 2011-07-22 MED ORDER — VITAMIN D3 25 MCG (1000 UNIT) PO TABS: 1000.0000 [IU] | ORAL_TABLET | Freq: Every day | ORAL | Status: DC

## 2011-07-22 MED ORDER — GLYCOPYRROLATE 0.2 MG/ML IJ SOLN
INTRAMUSCULAR | Status: DC | PRN
  Administered 2011-07-22: 0.2 mg via INTRAVENOUS

## 2011-07-22 MED ORDER — DOCUSATE SODIUM 100 MG PO CAPS
100.0000 mg | ORAL_CAPSULE | Freq: Two times a day (BID) | ORAL | Status: DC
  Administered 2011-07-22: 100 mg via ORAL

## 2011-07-22 MED ORDER — LIDOCAINE HCL (CARDIAC) 20 MG/ML IV SOLN
INTRAVENOUS | Status: DC | PRN
  Administered 2011-07-22: 50 mg via INTRAVENOUS

## 2011-07-22 MED ORDER — MIDAZOLAM HCL 2 MG/2ML IJ SOLN
1.0000 mg | INTRAMUSCULAR | Status: DC | PRN
  Administered 2011-07-22: 2 mg via INTRAVENOUS

## 2011-07-22 MED ORDER — ONDANSETRON HCL 4 MG/2ML IJ SOLN
INTRAMUSCULAR | Status: DC | PRN
  Administered 2011-07-22: 4 mg via INTRAVENOUS

## 2011-07-22 MED ORDER — PROMETHAZINE HCL 25 MG/ML IJ SOLN: 6.2500 mg | INTRAMUSCULAR | Status: DC | PRN

## 2011-07-22 MED ORDER — CEFAZOLIN SODIUM 1-5 GM-% IV SOLN
1.0000 g | Freq: Three times a day (TID) | INTRAVENOUS | Status: AC
  Administered 2011-07-22 – 2011-07-23 (×3): 1 g via INTRAVENOUS

## 2011-07-22 MED ORDER — ALUM & MAG HYDROXIDE-SIMETH 200-200-20 MG/5ML PO SUSP: 30.0000 mL | Freq: Four times a day (QID) | ORAL | Status: DC | PRN

## 2011-07-22 MED ORDER — FENTANYL CITRATE 0.05 MG/ML IJ SOLN
50.0000 ug | INTRAMUSCULAR | Status: DC | PRN
  Administered 2011-07-22: 100 ug via INTRAVENOUS

## 2011-07-22 MED ORDER — CEFAZOLIN SODIUM 1-5 GM-% IV SOLN
INTRAVENOUS | Status: DC | PRN
  Administered 2011-07-22: 2 g via INTRAVENOUS

## 2011-07-22 MED ORDER — KETOROLAC TROMETHAMINE 30 MG/ML IJ SOLN: 15.0000 mg | Freq: Once | INTRAMUSCULAR | Status: DC | PRN

## 2011-07-22 MED ORDER — METHOCARBAMOL 500 MG PO TABS
500.0000 mg | ORAL_TABLET | Freq: Three times a day (TID) | ORAL | Status: DC | PRN
  Administered 2011-07-22 – 2011-07-23 (×2): 500 mg via ORAL

## 2011-07-22 MED ORDER — MORPHINE SULFATE 10 MG/ML IJ SOLN
INTRAMUSCULAR | Status: DC | PRN
  Administered 2011-07-22 (×4): 2 mg via INTRAVENOUS

## 2011-07-22 MED ORDER — VITAMIN C 500 MG PO TABS
500.0000 mg | ORAL_TABLET | Freq: Every day | ORAL | Status: DC
  Administered 2011-07-22: 500 mg via ORAL

## 2011-07-22 MED ORDER — DIPHENHYDRAMINE HCL 25 MG PO CAPS: 25.0000 mg | ORAL_CAPSULE | Freq: Four times a day (QID) | ORAL | Status: DC | PRN

## 2011-07-22 MED ORDER — TEMAZEPAM 30 MG PO CAPS
30.0000 mg | ORAL_CAPSULE | Freq: Once | ORAL | Status: AC
  Administered 2011-07-22: 30 mg via ORAL

## 2011-07-22 MED ORDER — IBUPROFEN 200 MG PO TABS: 200.0000 mg | ORAL_TABLET | Freq: Four times a day (QID) | ORAL | Status: DC | PRN

## 2011-07-22 MED ORDER — METHOCARBAMOL 500 MG PO TABS: 500.0000 mg | ORAL_TABLET | Freq: Three times a day (TID) | ORAL | Status: AC

## 2011-07-22 MED ORDER — BUPIVACAINE HCL (PF) 0.5 % IJ SOLN
INTRAMUSCULAR | Status: DC | PRN
  Administered 2011-07-22: 20 mL

## 2011-07-22 MED ORDER — ONDANSETRON HCL 4 MG/2ML IJ SOLN: 4.0000 mg | Freq: Three times a day (TID) | INTRAMUSCULAR | Status: DC | PRN

## 2011-07-22 MED ORDER — PROPOFOL 10 MG/ML IV BOLUS
INTRAVENOUS | Status: DC | PRN
  Administered 2011-07-22: 200 mg via INTRAVENOUS

## 2011-07-22 MED ORDER — PHENYLEPHRINE HCL 10 MG/ML IJ SOLN
10.0000 mg | INTRAVENOUS | Status: DC | PRN
  Administered 2011-07-22: 50 ug/min via INTRAVENOUS

## 2011-07-22 MED ORDER — METHOCARBAMOL 100 MG/ML IJ SOLN: 500.0000 mg | Freq: Three times a day (TID) | INTRAVENOUS | Status: DC | PRN

## 2011-07-22 MED ORDER — OXYCODONE-ACETAMINOPHEN 5-325 MG PO TABS: 1.0000 | ORAL_TABLET | ORAL | Status: AC | PRN

## 2011-07-22 MED ORDER — MEPERIDINE HCL 25 MG/ML IJ SOLN: 6.2500 mg | INTRAMUSCULAR | Status: DC | PRN

## 2011-07-22 MED ORDER — OXYCODONE-ACETAMINOPHEN 5-325 MG PO TABS
1.0000 | ORAL_TABLET | ORAL | Status: DC | PRN
  Administered 2011-07-22 – 2011-07-23 (×5): 2 via ORAL

## 2011-07-22 MED ORDER — HYDROMORPHONE HCL PF 1 MG/ML IJ SOLN
0.2500 mg | INTRAMUSCULAR | Status: DC | PRN
  Administered 2011-07-22 (×3): 0.5 mg via INTRAVENOUS

## 2011-07-22 MED ORDER — ASPIRIN EC 325 MG PO TBEC: 325.0000 mg | DELAYED_RELEASE_TABLET | Freq: Two times a day (BID) | ORAL | Status: AC

## 2011-07-22 MED ORDER — MORPHINE SULFATE 4 MG/ML IJ SOLN
3.0000 mg | INTRAMUSCULAR | Status: DC | PRN
  Administered 2011-07-22: 3 mg via INTRAVENOUS

## 2011-07-22 MED ORDER — BISACODYL 10 MG RE SUPP: 10.0000 mg | Freq: Once | RECTAL | Status: DC

## 2011-07-22 MED ORDER — SODIUM CHLORIDE 0.9 % IV SOLN
INTRAVENOUS | Status: DC
  Administered 2011-07-22: 15:00:00 via INTRAVENOUS

## 2011-07-22 SURGICAL SUPPLY — 103 items
APL SKNCLS STERI-STRIP NONHPOA (GAUZE/BANDAGES/DRESSINGS) ×2
BANDAGE CONFORM 2  STR LF (GAUZE/BANDAGES/DRESSINGS) ×3 IMPLANT
BANDAGE ELASTIC 4 VELCRO ST LF (GAUZE/BANDAGES/DRESSINGS) ×5 IMPLANT
BANDAGE ELASTIC 6 VELCRO ST LF (GAUZE/BANDAGES/DRESSINGS) ×3 IMPLANT
BANDAGE GAUZE 4  KLING STR (GAUZE/BANDAGES/DRESSINGS) ×2 IMPLANT
BENZOIN TINCTURE PRP APPL 2/3 (GAUZE/BANDAGES/DRESSINGS) ×3 IMPLANT
BLADE ARTHRO LOK 4 BEAVER (BLADE) ×3 IMPLANT
BLADE AVERAGE 25X9 (BLADE) ×3 IMPLANT
BLADE CCA MICRO SAG (BLADE) IMPLANT
BLADE MICRO SAGITTAL (BLADE) ×2 IMPLANT
BLADE OSC/SAG .038X5.5 CUT EDG (BLADE) IMPLANT
BLADE SURG 11 STRL SS (BLADE) IMPLANT
BLADE SURG 15 STRL LF DISP TIS (BLADE) ×8 IMPLANT
BLADE SURG 15 STRL SS (BLADE) ×12
BRUSH SCRUB EZ PLAIN DRY (MISCELLANEOUS) ×3 IMPLANT
BUR EGG 3PK/BX (BURR) IMPLANT
CAP PIN PROTECTOR ORTHO WHT (CAP) IMPLANT
CLEANER CAUTERY TIP 5X5 PAD (MISCELLANEOUS) ×1 IMPLANT
CLOTH BEACON ORANGE TIMEOUT ST (SAFETY) ×3 IMPLANT
COTTON STERILE ROLL (GAUZE/BANDAGES/DRESSINGS) ×3 IMPLANT
COVER TABLE BACK 60X90 (DRAPES) ×3 IMPLANT
CUFF TOURNIQUET SINGLE 34IN LL (TOURNIQUET CUFF) ×3 IMPLANT
DRAPE EXTREMITY T 121X128X90 (DRAPE) ×3 IMPLANT
DRAPE INCISE IOBAN 66X45 STRL (DRAPES) ×2 IMPLANT
DRAPE OEC MINIVIEW 54X84 (DRAPES) ×3 IMPLANT
DRAPE PED LAPAROTOMY (DRAPES) ×2 IMPLANT
DRAPE SURG 17X23 STRL (DRAPES) ×11 IMPLANT
DRSG PAD ABDOMINAL 8X10 ST (GAUZE/BANDAGES/DRESSINGS) ×5 IMPLANT
DURA STEPPER LG (CAST SUPPLIES) IMPLANT
DURA STEPPER MED (CAST SUPPLIES) IMPLANT
DURA STEPPER SML (CAST SUPPLIES) IMPLANT
DURAPREP 26ML APPLICATOR (WOUND CARE) ×3 IMPLANT
ELECT REM PT RETURN 9FT ADLT (ELECTROSURGICAL) ×3
ELECTRODE REM PT RTRN 9FT ADLT (ELECTROSURGICAL) ×2 IMPLANT
FIBERLOOP 2 0 (SUTURE) ×2 IMPLANT
GAUZE SPONGE 4X4 16PLY XRAY LF (GAUZE/BANDAGES/DRESSINGS) IMPLANT
GAUZE XEROFORM 1X8 LF (GAUZE/BANDAGES/DRESSINGS) ×3 IMPLANT
GAUZE XEROFORM 5X9 LF (GAUZE/BANDAGES/DRESSINGS) IMPLANT
GLOVE BIO SURGEON STRL SZ 6.5 (GLOVE) ×6 IMPLANT
GLOVE BIO SURGEON STRL SZ8 (GLOVE) ×3 IMPLANT
GLOVE BIOGEL PI IND STRL 8 (GLOVE) ×4 IMPLANT
GLOVE BIOGEL PI INDICATOR 8 (GLOVE) ×2
GLOVE INDICATOR 7.0 STRL GRN (GLOVE) ×4 IMPLANT
GLOVE SURG SS PI 8.0 STRL IVOR (GLOVE) ×3 IMPLANT
GOWN BRE IMP PREV XXLGXLNG (GOWN DISPOSABLE) ×3 IMPLANT
GOWN PREVENTION PLUS XLARGE (GOWN DISPOSABLE) ×3 IMPLANT
KWIRE 4.0 X .045IN (WIRE) IMPLANT
KWIRE 4.0 X .062IN (WIRE) IMPLANT
NDL SUT 6 .5 CRC .975X.05 MAYO (NEEDLE) ×2 IMPLANT
NEEDLE HYPO 22GX1.5 SAFETY (NEEDLE) ×2 IMPLANT
NEEDLE MAYO TAPER (NEEDLE) ×3
NS IRRIG 1000ML POUR BTL (IV SOLUTION) ×3 IMPLANT
PACK BASIN DAY SURGERY FS (CUSTOM PROCEDURE TRAY) ×3 IMPLANT
PAD CAST 4YDX4 CTTN HI CHSV (CAST SUPPLIES) ×3 IMPLANT
PAD CLEANER CAUTERY TIP 5X5 (MISCELLANEOUS) ×1
PADDING CAST ABS 4INX4YD NS (CAST SUPPLIES) ×1
PADDING CAST ABS COTTON 4X4 ST (CAST SUPPLIES) ×2 IMPLANT
PADDING CAST COTTON 4X4 STRL (CAST SUPPLIES) ×6
PASSER SUT SWANSON 36MM LOOP (INSTRUMENTS) ×2 IMPLANT
PENCIL BUTTON HOLSTER BLD 10FT (ELECTRODE) ×3 IMPLANT
SCREW CORTEX 3.5 36MM (Screw) ×1 IMPLANT
SCREW CORTEX 3.5 40MM (Screw) ×2 IMPLANT
SCREW LOCK CORT ST 3.5X36 (Screw) ×2 IMPLANT
SCREW LOCK CORT ST 3.5X40 (Screw) ×2 IMPLANT
SHEET MEDIUM DRAPE 40X70 STRL (DRAPES) ×6 IMPLANT
SLEEVE SCD COMPRESS KNEE MED (MISCELLANEOUS) ×3 IMPLANT
SPLINT FAST PLASTER 5X30 (CAST SUPPLIES) ×1
SPLINT PLASTER CAST FAST 5X30 (CAST SUPPLIES) ×1 IMPLANT
SPONGE GAUZE 4X4 12PLY (GAUZE/BANDAGES/DRESSINGS) ×3 IMPLANT
SPONGE LAP 4X18 X RAY DECT (DISPOSABLE) ×2 IMPLANT
STOCKINETTE 6  STRL (DRAPES) ×1
STOCKINETTE 6 STRL (DRAPES) ×2 IMPLANT
STRIP CLOSURE SKIN 1/2X4 (GAUZE/BANDAGES/DRESSINGS) ×3 IMPLANT
SUCTION FRAZIER TIP 10 FR DISP (SUCTIONS) ×5 IMPLANT
SUT 2 FIBERLOOP 20 STRT BLUE (SUTURE)
SUT BONE WAX W31G (SUTURE) ×3 IMPLANT
SUT ETHIBOND 0 MO6 C/R (SUTURE) IMPLANT
SUT ETHIBOND 2 OS 4 DA (SUTURE) IMPLANT
SUT ETHILON 4 0 PS 2 18 (SUTURE) ×9 IMPLANT
SUT FIBERWIRE #2 38 T-5 BLUE (SUTURE)
SUT FIBERWIRE 2-0 18 17.9 3/8 (SUTURE) ×3
SUT MNCRL AB 4-0 PS2 18 (SUTURE) ×5 IMPLANT
SUT PDS AB 3-0 PS2 18 (SUTURE) ×1 IMPLANT
SUT PDS AB 4-0 P3 18 (SUTURE) ×2 IMPLANT
SUT VIC AB 0 SH 27 (SUTURE) IMPLANT
SUT VIC AB 2-0 PS2 27 (SUTURE) ×3 IMPLANT
SUT VIC AB 2-0 SH 18 (SUTURE) IMPLANT
SUT VIC AB 2-0 SH 27 (SUTURE)
SUT VIC AB 2-0 SH 27XBRD (SUTURE) IMPLANT
SUT VIC AB 3-0 PS1 18 (SUTURE) ×9
SUT VIC AB 3-0 PS1 18XBRD (SUTURE) ×6 IMPLANT
SUT VIC AB 3-0 SH 27 (SUTURE) ×3
SUT VIC AB 3-0 SH 27X BRD (SUTURE) ×1 IMPLANT
SUTURE 2 FIBERLOOP 20 STRT BLU (SUTURE) IMPLANT
SUTURE FIBERWR #2 38 T-5 BLUE (SUTURE) IMPLANT
SUTURE FIBERWR 2-0 18 17.9 3/8 (SUTURE) ×2 IMPLANT
SYR BULB 3OZ (MISCELLANEOUS) ×3 IMPLANT
SYR CONTROL 10ML LL (SYRINGE) ×2 IMPLANT
TOWEL OR 17X24 6PK STRL BLUE (TOWEL DISPOSABLE) ×9 IMPLANT
TOWEL OR NON WOVEN STRL DISP B (DISPOSABLE) ×3 IMPLANT
TUBE CONNECTING 20X1/4 (TUBING) ×6 IMPLANT
UNDERPAD 30X30 INCONTINENT (UNDERPADS AND DIAPERS) ×3 IMPLANT
WATER STERILE IRR 1000ML POUR (IV SOLUTION) IMPLANT

## 2011-07-22 NOTE — Progress Notes (Signed)
Assisted Dr. Massagee with popliteal/saphenous block. Side rails up, monitors on throughout procedure. See vital signs in flow sheet. Tolerated Procedure well. 

## 2011-07-22 NOTE — Anesthesia Preprocedure Evaluation (Signed)
Anesthesia Evaluation  Patient identified by MRN, date of birth, ID band Patient awake    Reviewed: Allergy & Precautions, H&P , NPO status , Patient's Chart, lab work & pertinent test results  History of Anesthesia Complications Negative for: history of anesthetic complications  Airway Mallampati: I  Neck ROM: Full    Dental  (+) Teeth Intact and Dental Advisory Given   Pulmonary neg pulmonary ROS,  clear to auscultation        Cardiovascular hypertension, Regular Normal    Neuro/Psych    GI/Hepatic negative GI ROS, Neg liver ROS,   Endo/Other    Renal/GU negative Renal ROS     Musculoskeletal   Abdominal   Peds  Hematology   Anesthesia Other Findings   Reproductive/Obstetrics                           Anesthesia Physical Anesthesia Plan  ASA: II  Anesthesia Plan: General   Post-op Pain Management:    Induction: Intravenous  Airway Management Planned: LMA  Additional Equipment:   Intra-op Plan:   Post-operative Plan: Extubation in OR  Informed Consent: I have reviewed the patients History and Physical, chart, labs and discussed the procedure including the risks, benefits and alternatives for the proposed anesthesia with the patient or authorized representative who has indicated his/her understanding and acceptance.   Dental advisory given  Plan Discussed with: CRNA and Surgeon  Anesthesia Plan Comments:         Anesthesia Quick Evaluation

## 2011-07-22 NOTE — H&P (Signed)
  H&P documentation: Placed to be scanned history and physical exam in chart.  -History and Physical Reviewed  -Patient has been re-examined  -No change in the plan of care  [name] A  

## 2011-07-22 NOTE — Anesthesia Procedure Notes (Addendum)
Anesthesia Regional Block:  Popliteal block  Pre-Anesthetic Checklist: ,, timeout performed, Correct Patient, Correct Site, Correct Laterality, Correct Procedure, Correct Position, site marked, Risks and benefits discussed, pre-op evaluation,  At surgeon's request and post-op pain management  Laterality: Lower  Prep: chloraprep       Needles:  Injection technique: Single-shot  Needle Type: Stimulator Needle - 80      Needle Gauge: 22 and 22 G  Needle insertion depth: 6 cm   Additional Needles:  Procedures: nerve stimulator Popliteal block  Nerve Stimulator or Paresthesia:  Response: Twitch elicited, 0.8 mA, 0.3 ms, 6 cm  Additional Responses:   Narrative:  Start time: 07/22/2011 8:00 AM End time: 07/22/2011 8:15 AM Injection made incrementally with aspirations every 40 mL.  Performed by: Personally  Anesthesiologist: Oren Section, MD  Additional Notes: This is a lateral approach to the popliteal fossa area. A stimulator needle is used starting at 1.5 mAmp current and descending as nerve contact is made. Injection of anesthetic is in 5 ml increments with multiple neg aspirations before continuing. Total volume is 40 cc.    Popliteal block Procedure Name: LMA Insertion Performed by: Sharyne Richters Pre-anesthesia Checklist: Patient identified, Emergency Drugs available, Suction available, Patient being monitored and Timeout performed Patient Re-evaluated:Patient Re-evaluated prior to inductionOxygen Delivery Method: Circle System Utilized Preoxygenation: Pre-oxygenation with 100% oxygen Intubation Type: IV induction Ventilation: Mask ventilation without difficulty LMA: LMA with gastric port inserted LMA Size: 4.0 Number of attempts: 1 Placement Confirmation: breath sounds checked- equal and bilateral and positive ETCO2 Tube secured with: Tape

## 2011-07-22 NOTE — Anesthesia Postprocedure Evaluation (Signed)
  Anesthesia Post-op Note  Patient: [name]  Procedure(s) Performed:  CALCANEAL OSTEOTOMY WITH TARSAL METATARSAL FUSION - left lengthening calcaneal osteotomy, iliac crest bone graft, hardware removal deep foot, correction first tmt joint osteotomy (plantar flexion/valgus), y, repair posterior tibial tendon tear, fdl to navicular tendon transfer  Patient Location: PACU  Anesthesia Type: GA combined with regional for post-op pain  Level of Consciousness: awake  Airway and Oxygen Therapy: Patient Spontanous Breathing  Post-op Pain: mild  Post-op Assessment: Post-op Vital signs reviewed  Post-op Vital Signs: stable  Complications: No apparent anesthesia complications

## 2011-07-22 NOTE — Transfer of Care (Signed)
Immediate Anesthesia Transfer of Care Note  Patient: [name]  Procedure(s) Performed:  CALCANEAL OSTEOTOMY WITH TARSAL METATARSAL FUSION - left lengthening calcaneal osteotomy, iliac crest bone graft, hardware removal deep foot, correction first tmt joint osteotomy (plantar flexion/valgus), y, repair posterior tibial tendon tear, fdl to navicular tendon transfer  Patient Location: PACU  Anesthesia Type: General  Level of Consciousness: awake, alert  and oriented  Airway & Oxygen Therapy: Patient Spontanous Breathing and Patient connected to face mask oxygen  Post-op Assessment: Report given to PACU RN and Post -op Vital signs reviewed and stable  Post vital signs: Reviewed and stable  Complications: No apparent anesthesia complications

## 2011-07-22 NOTE — Brief Op Note (Signed)
07/22/2011  12:59 PM  PATIENT:  [name]  55 y.o. female  PRE-OPERATIVE DIAGNOSIS:  1.  Complication of hardware left foot 2.  Left valgus hindfoot malalignment 3.  Left hallux valgus 4.  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  Left posterior tibial tendon tear   POST-OPERATIVE DIAGNOSIS: 1.  Complication of hardware left foot 2.  Left valgus hindfoot malalignment 3.  Left hallux valgus 4.  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  Left posterior tibial tendon tear   PROCEDURE:  Procedure(s): 1.  Lengthening Left Calcaneal osteotomy 2.  Left Iliac crest bone graft 3.  Left Hardware removal deep foot 4.  Stress xrays left foot 5.  Left 1st TMT fusion with osteotomy 6.  Left Modified McBride Bunionectomy 7.  Left Repair Posterior Tibial Tendon Tear 8. Left FDL to Navicular tendon transfer   SURGEON:  Surgeon(s): Sherri Rad  PHYSICIAN ASSISTANT:   ASSISTANTS: Rexene Edison PAC   ANESTHESIA:   general  EBL:  Total I/O In: 2000 [I.V.:2000] Out: -   BLOOD ADMINISTERED:none  DRAINS: none   LOCAL MEDICATIONS USED:  MARCAINE 20CC  SPECIMEN:  No Specimen  DISPOSITION OF SPECIMEN:  N/A  COUNTS:  YES  TOURNIQUET:  120 mins  DICTATION: .Other Dictation: Dictation Number [id number]  PLAN OF CARE: Admit for overnight observation  PATIENT DISPOSITION:  PACU - hemodynamically stable.   Delay start of Pharmacological VTE agent (>24hrs) due to surgical blood loss or risk of bleeding:  {YES/NO/NOT APPLICABLE:20182

## 2011-07-23 NOTE — Op Note (Signed)
[name], [name]        ACCOUNT NO.:  [id number]  MEDICAL RECORD NO.:  [id number]  LOCATION:                                 FACILITY:  PHYSICIAN:  [name], M.D.          DATE OF BIRTH:  DATE OF PROCEDURE:  07/22/2011 DATE OF DISCHARGE:                              OPERATIVE REPORT   PREOPERATIVE DIAGNOSES: 1. Complication of hardware, left foot. 2. Left hindfoot valgus malalignment. 3. Left hallux valgus. 4. Left first tarsometatarsal joint fusion malunion. 5. Left posterior tibial tendon tear with insufficiency.  POSTOPERATIVE DIAGNOSES: 1. Complication of hardware, left foot. 2. Left hindfoot valgus malalignment. 3. Left hallux valgus. 4. Left first tarsometatarsal joint fusion malunion. 5. Left posterior tibial tendon tear with insufficiency.  OPERATION: 1. Left lengthening calcaneal osteotomy. 2. Left iliac crest bone graft. 3. Hardware removal deep left foot. 4. Stress x-rays left foot. 5. Left first tarsometatarsal joint fusion with osteotomy. 6. Left modified McBride bunionectomy. 7. Left repair of posterior tibial tendon tear. 8. Left flexor digitorum longus to navicular tendon transfer.  ANESTHESIA:  General.  SURGEON:  Wendy Grills, MD  ASSISTANT:  Richardean Canal, PA-C  ESTIMATED BLOOD LOSS:  Minimal.  TOURNIQUET TIME:  2 hours.  COMPLICATIONS:  None.  IMPLANTS:  Synthes stainless steel screws.  DISPOSITION:  Stable to PR.  INDICATION:  This is a 55 year old female who has had a previous left first TMT joint fusion with osteotomy and went on to an extension varus malunion and developed a recurrence of hallux valgus, and also flattening of her foot.  She has also developed posterior tibial tendon insufficiency with a tear that was documented on MRI.  She also had persistent pain despite conservative management over the posterior tibial tendon that was interfering with her life.  She had a previous gastroc slide, and has not had iliac  crest graft on this side.  She was consented for the above procedure.  All risks, infection or vessel injury, nonunion, malunion, hardware irritation, hardware failure, persistent pain, worse pain, prolonged recovery, stiffness, arthritis, wound healing problems, DVT, PE, were all explained.  Questions were encouraged and answered.  DESCRIPTION:  The patient was brought to the operating room and placed in supine position after adequate general anesthesia was administered as well as Ancef 1 g IV piggyback.  A bump was placed on the left ipsilateral hip internally rotating the left lower extremity and left lower extremity as well as left iliac crest bone graft site were prepped and draped in a sterile manner over a proximally placed thigh tourniquet.  We started the procedure with a longitudinal incision over the iliac crest bone graft site.  Dissection was carried down through the skin and hemostasis was obtained.  Careful dissection was carried down to bone.  The lateral femoral cutaneous nerve was not encountered. With soft tissue retracted out of harm's way, the periosteum was elevated off the superior aspect over the iliac crest tuberosity.  Once this was done on both the middle and inner and outer tables, then with a sagittal saw, approximately 8 mm wide trapezoidal shaped tricortical bone block was then harvested.  This was cut also with a curved corners osteotome carefully  osteotomizing it, and this was placed on the back table for later grafting.  The area was copiously irrigated with normal saline.  Marcaine was instilled in this area.  Bone wax was applied to expose bony surfaces.  Deep tissues were closed with 2-0 Vicryl, subcu was closed with 2-0 Vicryl, skin was closed with 4-0 Monocryl subcuticular stitch.  Steri-Strips were applied.  Left lower extremity was gravity exsanguinated.  Tourniquet elevated to 290 mmHg.  A longitudinal incision over the lateral aspect of the  calcaneal neck CC joint region was then made, and dissection was carried down through the skin and hemostasis was obtained.  Peroneal tendons were carefully retracted out of harm's way.  Extension digitorum brevis was then elevated off the superior aspect of the calcaneal neck.  Then, measured approximately 1.2 cm proximal to the CC joint.  A calcaneal lengthening osteotomy was then made protecting the soft tissue both superiorly and inferiorly with Hohmann retractors.  This cut was made perpendicular to the lateral wall of the calcaneus parallel to the CC joint.  Once this was osteotomized, this was then opened and stretched with a small lamina spreader.  We then tamped into place approximately 8-mm tricortical bone block trapezoidal shape into the lengthening site.  We then fixed this with a 3.5-mm fully-threaded cortical set screw using a 2.5 mm hole respectively.  This was placed from the anterior process calcaneus across the osteotomy site into the proximal portion of the calcaneus. This had excellent purchase and maintenance of the desired position. Stress x-rays were obtained, and lateral planes showed no gross motion, fixation, proposition, and excellent alignment as well.  AP talocalcaneal angle was restored as well, as well as the medial talar head on coverage was improved.  We then made a longitudinal incision over the previous incision between the EHL and EHB. Dissection was carried down through the skin and hemostasis was obtained.  Careful dissection was carried down through scar.  We then identified both the screws using 3.5 screws both of which were removed and were intact. Then, under C-arm guidance, we identified the TMT joint.  Then, with a sagittal saw, an osteotomy was then made.  Once this was cut, we then, using a lamina spreader, opened through the osteotomy site.  We then did a closing wedge osteotomy in 2 planes by removing a slight bit on the lateral portion and  a bit on the plantar aspect of the distal portion of the bone.  Once this was done, we had perfectly congruent surface and reduced this with a two-point reduction clamp.  This was provisionally fixed with a K-wire.  We then placed a 3.5-mm fully-threaded cortical lag screw using a 3.5 and 2.5 mm drill hole respectively.  This again had excellent purchase and maintenance of the desired position.  K-wire was removed and this was replaced with 3.5 mm fully-threaded cortical lag screw using a 3.5 and 2.5 mm drill hole respectively.  Again, this had excellent purchase and maintenance of the desired position.  This was countersunk with the bur prior to placement.  Stress x-rays were obtained in AP and lateral planes, this showed no gross motion, fixation, proposition or excellent alignment as well.  We then made a longitudinal incision over the dorsal lateral aspect of great toe MTP joint.  Dissection was carried down through the skin and hemostasis was obtained.  Careful dissection was carried down to the lateral capsule. This was then released with a scalpel.  Once this was released,  we then released the adductor hallucis to the sesamoids as well.  We then were able to reduce the sesamoids beautifully.  We then obtained an x-ray to verify that the sesamoids were anatomically reduced and clinically, range of motion of the great toe MTP joint was significantly improved. We then removed the bump from the hip from underneath the drapes externally rotating the foot, then made a longitudinal incision over the posterior tibial tendon.  Dissection was carried down through the skin and hemostasis was obtained.  Retinaculum was opened over the poster tibial tendon.  There was a large amount of tenosynovial fluid that was expressed, and large amount of synovitis in this area.  A formal tenosynovectomy was then performed.  There was a tear on the plantar aspect of the tendon and it was a large groove  extended to the insertion on the navicular.  We then identified the FDL tendon, traced this to knot of Henry, and tenotomized this as distal as possible.  Then, using drill holes, we drilled through the navicular tuberosity from dorsal to plantar.  We then placed a 2-0 FiberLoop through the end of the FDL tendon.  We then using a Swanson suture passer pulled this from plantar and dorsal through the drill hole in the navicular, and then sewed this to the posterior tibial tendon dorsally and local periosteum using the 2- 0 FiberWire stitch.  This had outstanding repair.  We then repaired the posterior tibial tendon tear, incorporating the FDL tendon into the tear and this was reconstructed and repaired with 2-0 FiberWire stitch.  This had outstanding repair.  The area was copiously irrigated with normal saline.  Tourniquet was deflated.  Hemostasis was obtained.  Subcu was closed with 3-0 Vicryl.  Skin was closed with 4-0 nylon over all wounds. Sterile dressing was applied, [employer] dressing was applied, and Modified Jones dressing was applied __________ neutral dorsiflexion. The patient was stable to the PAR.     [name], M.D.     PB/MEDQ  D:  07/22/2011  T:  07/22/2011  Job:  [id number]

## 2016-11-26 ENCOUNTER — Other Ambulatory Visit (HOSPITAL_COMMUNITY): Payer: Self-pay | Admitting: Orthopedic Surgery

## 2016-11-26 DIAGNOSIS — M25561 Pain in right knee: Secondary | ICD-10-CM

## 2016-12-09 ENCOUNTER — Encounter (HOSPITAL_COMMUNITY): Payer: Commercial Managed Care - PPO

## 2016-12-09 ENCOUNTER — Encounter (HOSPITAL_COMMUNITY): Payer: Self-pay

## 2017-10-04 ENCOUNTER — Ambulatory Visit: Payer: Commercial Managed Care - PPO | Admitting: Physical Therapy

## 2017-10-04 ENCOUNTER — Other Ambulatory Visit: Payer: Self-pay

## 2017-10-04 ENCOUNTER — Ambulatory Visit: Payer: Commercial Managed Care - PPO | Attending: Family Medicine

## 2017-10-04 DIAGNOSIS — M25611 Stiffness of right shoulder, not elsewhere classified: Secondary | ICD-10-CM | POA: Insufficient documentation

## 2017-10-04 DIAGNOSIS — M25511 Pain in right shoulder: Secondary | ICD-10-CM | POA: Insufficient documentation

## 2017-10-04 DIAGNOSIS — R293 Abnormal posture: Secondary | ICD-10-CM | POA: Insufficient documentation

## 2017-10-04 NOTE — Therapy (Addendum)
[place], [place], [address] Phone: [phone]   Fax:  [phone]  Physical Therapy Evaluation/ Discharge  Patient Details  Name: [name] MRN: [id number] Date of Birth: [birth year] Referring Provider: Cari Caraway, MD   Encounter Date: 10/04/2017  PT End of Session - 10/04/17 0929    Visit Number  1    Number of Visits  12    Date for PT Re-Evaluation  11/12/17    PT Start Time  0927    PT Stop Time  1015    PT Time Calculation (min)  48 min    Activity Tolerance  Patient tolerated treatment well    Behavior During Therapy  [facility] for tasks assessed/performed       Past Medical History:  Diagnosis Date  . Arthritis   . Hypertension    2007 when she had hyst-bp elevated-evaluated-no meds    Past Surgical History:  Procedure Laterality Date  . ABDOMINAL HYSTERECTOMY  2007   vag-bilat so  . CHOLECYSTECTOMY     lap choli  . FOOT OSTEOTOMY     both feet  . JOINT REPLACEMENT  2008   bilat knees   . TONSILLECTOMY    . TUBAL LIGATION      There were no vitals filed for this visit.   Subjective Assessment - 10/04/17 0935    Subjective  She reports RT shoulder pain. Previously played tennis alot . 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 Hears a click    Pertinent History  TKA x 2     Limitations  Lifting can't sleep RT side,  serve tennis ball,  exer bands overhead     How long can you sit comfortably?  As needed    How long can you stand comfortably?  As needed    How long can you walk comfortably?  as needed    Diagnostic tests  None    Patient Stated Goals  She wants to be pain free, strengthen , figure whats wrong    Currently in Pain?  No/denies at rest    Pain Score  7  using arm    Pain Location  Shoulder    Pain Orientation  Right;Lateral    Pain Descriptors / Indicators  Tightness;Heaviness;Aching    Pain  Type  Chronic pain    Pain Radiating Towards  to elbow lateral aspect    Pain Onset  More than a month ago    Pain Frequency  Intermittent    Aggravating Factors   using arm overhead    Pain Relieving Factors  tylenol . rest    Multiple Pain Sites  No         [facility] PT Assessment - 10/04/17 0001      Assessment   Medical Diagnosis  RTC syndrome RT     Referring Provider  Cari Caraway, MD    Onset Date/Surgical Date  -- more than a year worse pain last few months    Hand Dominance  Right    Next MD Visit  As needed    Prior Therapy  No      Precautions   Precautions  None      Restrictions   Weight Bearing Restrictions  No      Balance Screen   Has the patient fallen in the past 6 months  No  Prior Function   Level of Independence  Independent      Cognition   Overall Cognitive Status  Within Functional Limits for tasks assessed      Observation/Other Assessments   Focus on Therapeutic Outcomes (FOTO)   45% limited      Posture/Postural Control   Posture Comments  rounded shoulders      ROM / Strength   AROM / PROM / Strength  AROM;PROM;Strength      AROM   AROM Assessment Site  Shoulder    Right/Left Shoulder  Right;Left    Right Shoulder Extension  40 Degrees    Right Shoulder Flexion  145 Degrees    Right Shoulder ABduction  125 Degrees    Right Shoulder Internal Rotation  40 Degrees reaching behind back 7 inches less on RT    Right Shoulder External Rotation  90 Degrees    Right Shoulder Horizontal ABduction  5 Degrees    Right Shoulder Horizontal  ADduction  100 Degrees    Left Shoulder Extension  60 Degrees    Left Shoulder Flexion  158 Degrees    Left Shoulder ABduction  165 Degrees    Left Shoulder Internal Rotation  67 Degrees    Left Shoulder External Rotation  90 Degrees    Left Shoulder Horizontal ABduction  22 Degrees    Left Shoulder Horizontal ADduction  110 Degrees      PROM   Overall PROM Comments  still limted with IR  Other ranges  close to LT ranges      Strength   Overall Strength Comments  normal both UE      Palpation   Palpation comment  decr inf glide RT , forward RT shoulder             Objective measurements completed on examination: See above findings.                PT Short Term Goals - 10/04/17 0930      PT SHORT TERM GOAL #1   Title  She will be independent with iniital HEP    Time  2    Period  Weeks    Status  New      PT SHORT TERM GOAL #2   Title  She will report pain decreased 30% or more  RT shoulder    Time  3    Period  Weeks    Status  New      PT SHORT TERM GOAL #3   Title  AROM IR incr to 45 degrees    Time  3    Period  Weeks    Status  New        PT Long Term Goals - 10/04/17 0930      PT LONG TERM GOAL #1   Title  She willl be independent with all HEP issued    Time  6    Period  Weeks    Status  New      PT LONG TERM GOAL #2   Title  RT shoulder pain decr 75% or more   reaching ovehead    Time  6    Period  Weeks    Status  New      PT LONG TERM GOAL #3   Title  She will be able to lye on RT side for 30-60 min without incr pain.     Time  6    Period  Weeks  Status  New      PT LONG TERM GOAL #4   Title  She will be able to ligthly serve tennis ball with no pain x 20 reps    Time  6    Period  Weeks    Status  New      PT LONG TERM GOAL #5   Title  Reaching behind back will be within 1-2 inches of Lt active     Time  6    Period  Weeks    Status  New             Plan - 10/04/17 0929    Clinical Impression Statement  Ms Deckard presents with chroinic intermittant RT shoulder pain with stiffness of Rt shoulder and tenderness in deltoid and RT shoulder rotated foreward. She is limited wiuth overhead reaching and lying on ERT shoulder due to pain . She is not playing tennis due to pain.     Clinical Presentation  Stable    Clinical Decision Making  Low    Rehab Potential  Good    PT Frequency  2x / week    PT  Duration  6 weeks    PT Treatment/Interventions  Cryotherapy;Electrical Stimulation;Iontophoresis 27m/ml Dexamethasone;Moist Heat;Ultrasound;Therapeutic exercise;Manual techniques;Dry needling;Taping;Patient/family education    PT Next Visit Plan  Review HEP . manual for ROM , taping  AC joint, modalties     PT Home Exercise Plan  scapula retraction with posture. ER and IR stretching    Consulted and Agree with Plan of Care  Patient       Patient will benefit from skilled therapeutic intervention in order to improve the following deficits and impairments:  Postural dysfunction, Pain, Impaired UE functional use, Decreased range of motion  Visit Diagnosis: Right shoulder pain, unspecified chronicity - Plan: PT plan of care cert/re-cert  Abnormal posture - Plan: PT plan of care cert/re-cert  Stiffness of right shoulder, not elsewhere classified - Plan: PT plan of care cert/re-cert     Problem List There are no active problems to display for this patient.   CDarrel Hoover PT 10/04/2017, 10:38 AM  [facility] [place] [address]Phone: [phone]  Fax:  [phone] Name: MJESENIA SPERAMRN: [id number] of Birth: 802/06/1957PHYSICAL THERAPY DISCHARGE SUMMARY  Visits from Start of Care: 1  Current functional level related to goals / functional outcomes: She canceled her appointments here and reported doing Dry needling somewhere else and is doing HEP so canceled all appointments here  Remaining deficits: See above   Education / Equipment: HEP Plan: Patient agrees to discharge.  Patient goals were not met. Patient is being discharged due to the patient's request.  ?????   SNoralee StainPT   10/11/17

## 2017-10-06 ENCOUNTER — Ambulatory Visit: Payer: Commercial Managed Care - PPO | Admitting: Physical Therapy

## 2017-10-11 ENCOUNTER — Ambulatory Visit: Payer: Commercial Managed Care - PPO | Admitting: Physical Therapy

## 2017-10-19 ENCOUNTER — Encounter: Payer: Commercial Managed Care - PPO | Admitting: Physical Therapy

## 2017-10-27 ENCOUNTER — Encounter: Payer: Commercial Managed Care - PPO | Admitting: Physical Therapy

## 2018-01-25 ENCOUNTER — Other Ambulatory Visit (HOSPITAL_COMMUNITY): Payer: Self-pay | Admitting: Orthopedic Surgery

## 2018-01-25 DIAGNOSIS — Z96653 Presence of artificial knee joint, bilateral: Secondary | ICD-10-CM

## 2018-01-28 ENCOUNTER — Encounter (HOSPITAL_COMMUNITY): Admission: RE | Admit: 2018-01-28 | Payer: Commercial Managed Care - PPO | Source: Ambulatory Visit

## 2018-01-28 ENCOUNTER — Encounter (HOSPITAL_COMMUNITY)
Admission: RE | Admit: 2018-01-28 | Discharge: 2018-01-28 | Disposition: A | Discharge status: Home / Self Care | Payer: Commercial Managed Care - PPO | Source: Ambulatory Visit | Attending: Orthopedic Surgery | Admitting: Orthopedic Surgery

## 2018-01-28 DIAGNOSIS — Z96653 Presence of artificial knee joint, bilateral: Secondary | ICD-10-CM | POA: Insufficient documentation

## 2018-01-28 MED ORDER — TECHNETIUM TC 99M MEDRONATE IV KIT
20.0000 | PACK | Freq: Once | INTRAVENOUS | Status: AC | PRN
  Administered 2018-01-28: 20 via INTRAVENOUS

## 2019-10-11 IMAGING — NM NM BONE 3 PHASE
6 series · 16 of 16 positions shown · non-contrast
Comparison: None

Radiographic correlation: None

CLINICAL DATA: RIGHT knee pain for 2 years intermittently,
BILATERAL total knee replacement surgery 10 years ago, question
prosthetic loosening

EXAM:
NUCLEAR MEDICINE 3-PHASE BONE SCAN
TECHNIQUE: Radionuclide angiographic images, immediate static blood pool
images, and 3-hour delayed static images were obtained of the knees
after intravenous injection of radiopharmaceutical.
RADIOPHARMACEUTICALS:  21.1 mCi Yc-55m MDP IV

[Series 1: flow · 2.07mm/px · 6 of 48 frames shown (1 of 2)]
[frame 5/48  full-range]
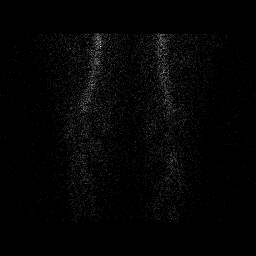
[frame 13/48  full-range]
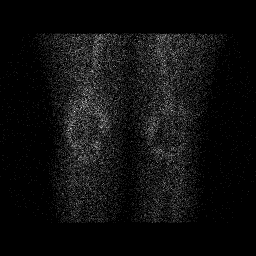
[frame 21/48  full-range]
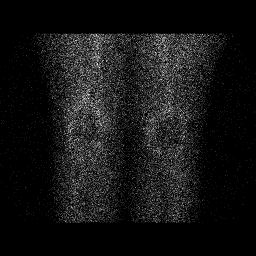
[frame 29/48  full-range]
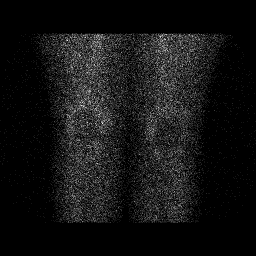
[frame 37/48  full-range]
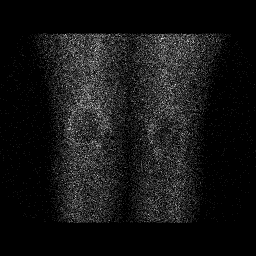
[frame 45/48  full-range]
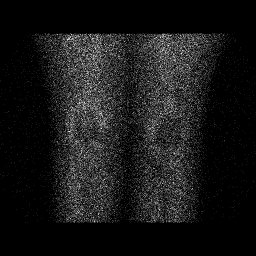

[Series 1: flow · 2.07mm/px · 6 of 48 frames shown (2 of 2)]
[frame 5/48]
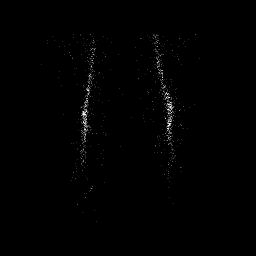
[frame 13/48  full-range]
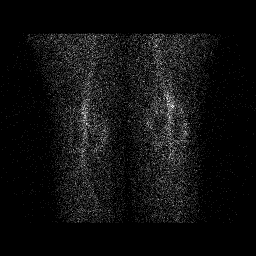
[frame 21/48  full-range]
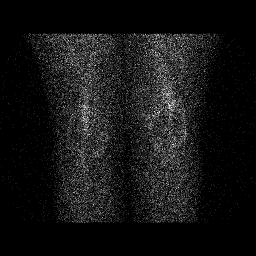
[frame 29/48  full-range]
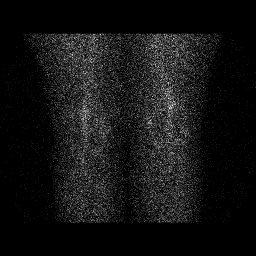
[frame 37/48  full-range]
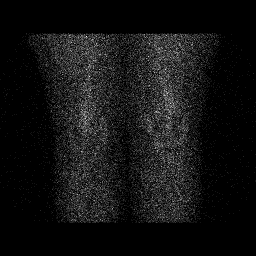
[frame 45/48  full-range]
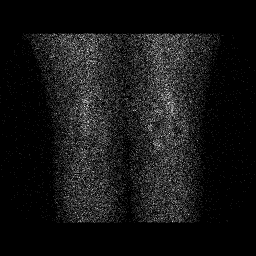

[Series 2: blood pool · 2.07mm/px · 1 of 1 slices shown (1 of 2)]
[im 1/1  full-range]
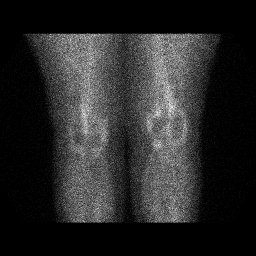

[Series 2: blood pool · 2.07mm/px · 1 of 1 slices shown (2 of 2)]
[im 1/1  full-range]
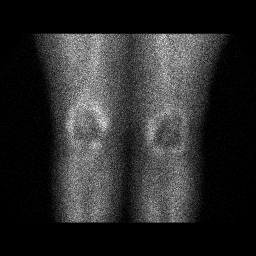

[Series 3: lat bp · 2.07mm/px · 1 of 1 slices shown (1 of 2)]
[im 1/1]
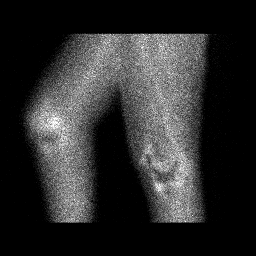

[Series 3: lat bp · 2.07mm/px · 1 of 1 slices shown (2 of 2)]
[im 1/1]
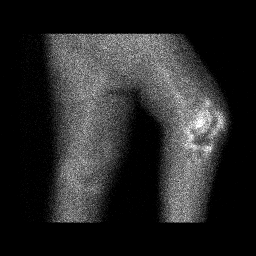

[16 of 16 positions shown; findings below may reference images not displayed]

FINDINGS: Vascular phase: Increased blood flow surrounding both knee joints

Blood pool phase: Increased blood pool surrounding both knee joints
greater on RIGHT than LEFT

Delayed phase: Diffuse mildly increased tracer localization is seen
surrounding both knee joints. More focal area of increased tracer
accumulation is seen at the medial RIGHT tibial plateau which could
represent aseptic loosening or infection. No focal areas of abnormal
tracer accumulation are identified at the LEFT knee.
IMPRESSION: Increased blood flow and blood pool tracer at both knees greater on
RIGHT suggesting synovitis.

Focal increased tracer localization at the RIGHT medial tibial
plateau adjacent to the prosthesis, could represent aseptic
loosening or infection of the RIGHT knee prosthesis.
